# Patient Record
Sex: Male | Born: 1960 | Race: White | Hispanic: Yes | Marital: Married | Smoking: Never smoker
Health system: Southern US, Community
[De-identification: ages and names within clinical notes are randomized; demographics above are authoritative.]

## PROBLEM LIST (undated history)

## (undated) DIAGNOSIS — Z860100 Personal history of colon polyps, unspecified: Secondary | ICD-10-CM

## (undated) HISTORY — DX: Personal history of colon polyps, unspecified: Z86.0100

---

## 1981-04-05 HISTORY — PX: APPENDECTOMY: SHX54

## 1996-04-05 HISTORY — PX: COLONOSCOPY: SHX174

## 2006-12-30 ENCOUNTER — Ambulatory Visit: Payer: Self-pay

## 2014-04-05 HISTORY — PX: ESOPHAGOGASTRODUODENOSCOPY: SHX1529

## 2014-06-04 ENCOUNTER — Ambulatory Visit
Admission: RE | Admit: 2014-06-04 | Discharge: 2014-06-04 | Disposition: A | Discharge status: Home / Self Care | Payer: Managed Care, Other (non HMO) | Source: Ambulatory Visit | Attending: Internal Medicine | Admitting: Internal Medicine

## 2014-06-04 ENCOUNTER — Other Ambulatory Visit: Payer: Self-pay | Admitting: Internal Medicine

## 2014-06-04 DIAGNOSIS — K5909 Other constipation: Secondary | ICD-10-CM

## 2014-06-04 DIAGNOSIS — K625 Hemorrhage of anus and rectum: Secondary | ICD-10-CM

## 2014-06-04 DIAGNOSIS — R059 Cough, unspecified: Secondary | ICD-10-CM

## 2014-06-04 DIAGNOSIS — R109 Unspecified abdominal pain: Secondary | ICD-10-CM

## 2014-06-04 DIAGNOSIS — R05 Cough: Secondary | ICD-10-CM

## 2014-06-06 ENCOUNTER — Other Ambulatory Visit: Payer: Self-pay | Admitting: Internal Medicine

## 2014-06-06 DIAGNOSIS — R109 Unspecified abdominal pain: Secondary | ICD-10-CM

## 2014-06-06 DIAGNOSIS — R1013 Epigastric pain: Secondary | ICD-10-CM

## 2014-06-10 ENCOUNTER — Other Ambulatory Visit: Payer: Managed Care, Other (non HMO)

## 2014-06-14 ENCOUNTER — Ambulatory Visit
Admission: RE | Admit: 2014-06-14 | Discharge: 2014-06-14 | Disposition: A | Discharge status: Home / Self Care | Payer: Managed Care, Other (non HMO) | Source: Ambulatory Visit | Attending: Internal Medicine | Admitting: Internal Medicine

## 2014-06-14 DIAGNOSIS — R109 Unspecified abdominal pain: Secondary | ICD-10-CM

## 2014-06-14 DIAGNOSIS — R1013 Epigastric pain: Secondary | ICD-10-CM

## 2015-11-12 IMAGING — RF DG UGI W/ HIGH DENSITY W/KUB
19 of 24 series · 19 of 24 positions shown · non-contrast
Comparison: KUB 06/04/2014.

CLINICAL DATA: 53-year-old male with abdominal pain and dyspepsia.
Initial encounter.

EXAM:
UPPER GI SERIES WITH KUB
TECHNIQUE: After obtaining a scout radiograph a routine upper GI series was
performed using effervescent crystals and barium
FLUOROSCOPY TIME:  Radiation Exposure Index (as provided by the
fluoroscopic device): 140 d GY cm2
If the device does not provide the exposure index:
Fluoroscopy Time (in minutes and seconds):  4 minutes 48 seconds
Number of Acquired Images:  5

[Series 1: run · 1 of 1 slices shown (1 of 19)]
[im 1/1]
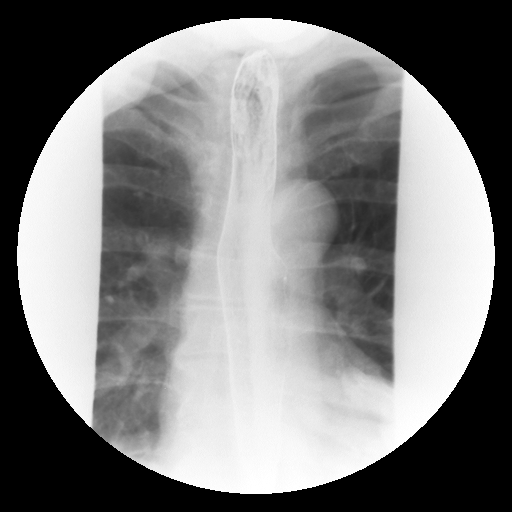

[Series 2: run · 1 of 1 slices shown (2 of 19)]
[im 1/1]
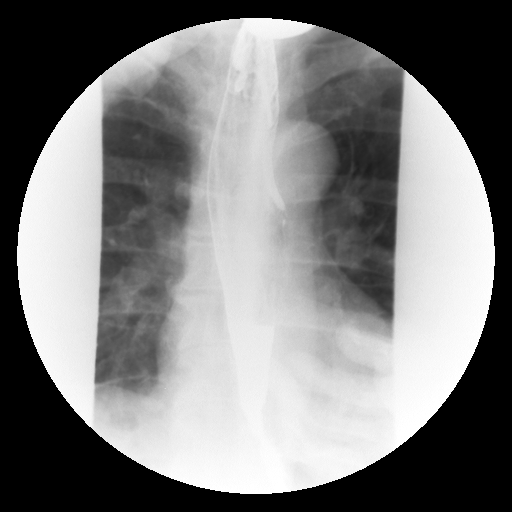

[Series 4: run · 1 of 1 slices shown (3 of 19)]
[im 1/1]
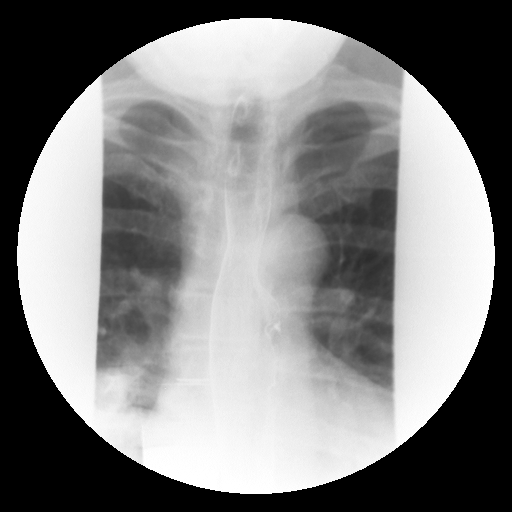

[Series 5: run · 1 of 1 slices shown (4 of 19)]
[im 1/1]
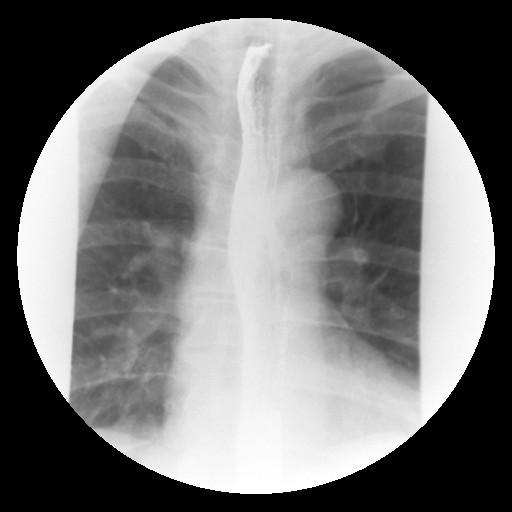

[Series 6: run · 1 of 1 slices shown (5 of 19)]
[im 1/1]
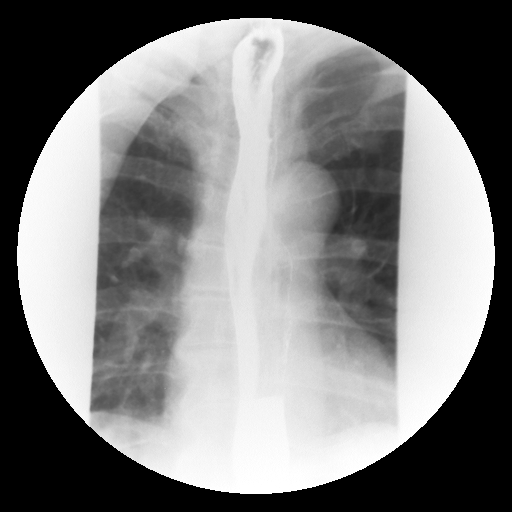

[Series 7: run · 1 of 1 slices shown (6 of 19)]
[im 1/1]
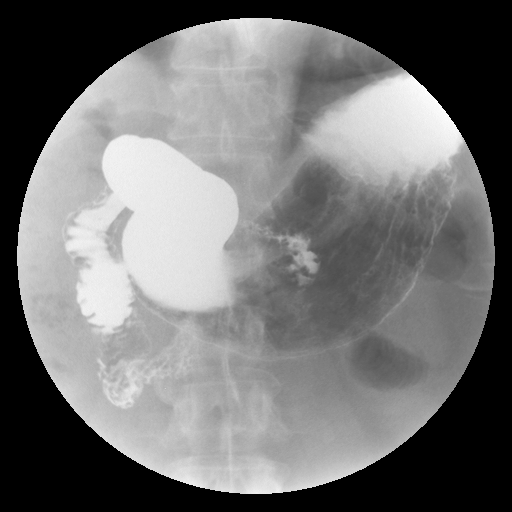

[Series 9: run · 1 of 1 slices shown (7 of 19)]
[im 1/1]
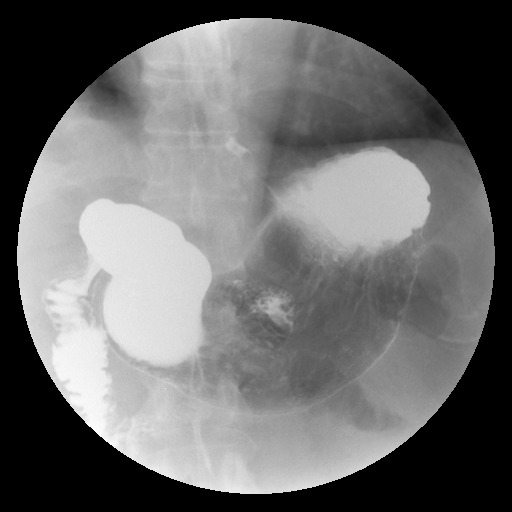

[Series 10: run · 1 of 1 slices shown (8 of 19)]
[im 1/1]
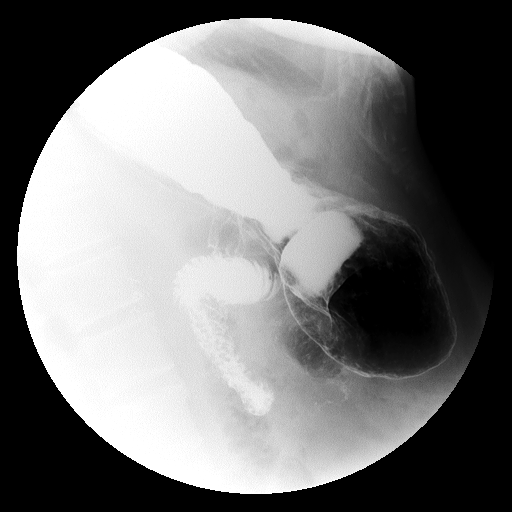

[Series 11: run · 1 of 1 slices shown (9 of 19)]
[im 1/1]
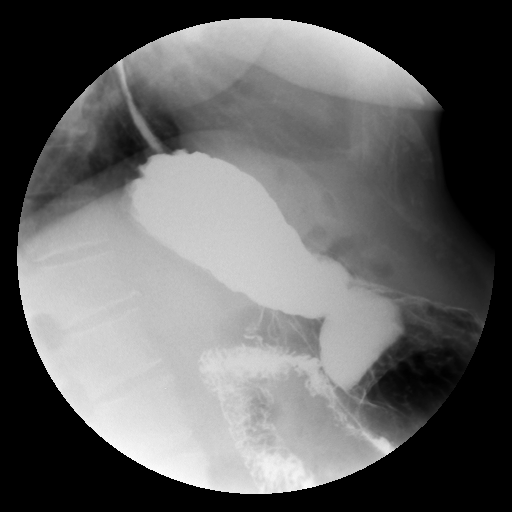

[Series 13: run · 1 of 1 slices shown (10 of 19)]
[im 1/1]
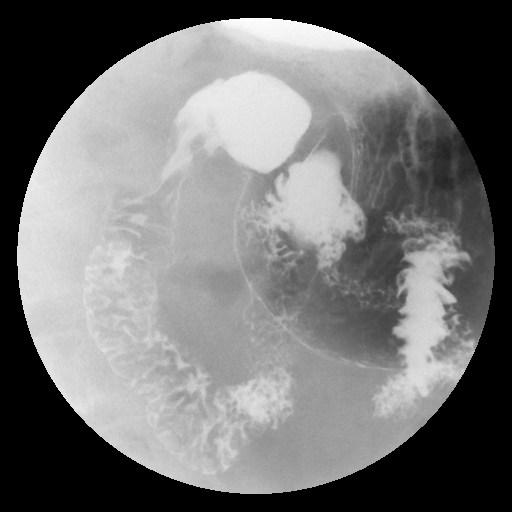

[Series 14: run · 1 of 1 slices shown (11 of 19)]
[im 1/1]
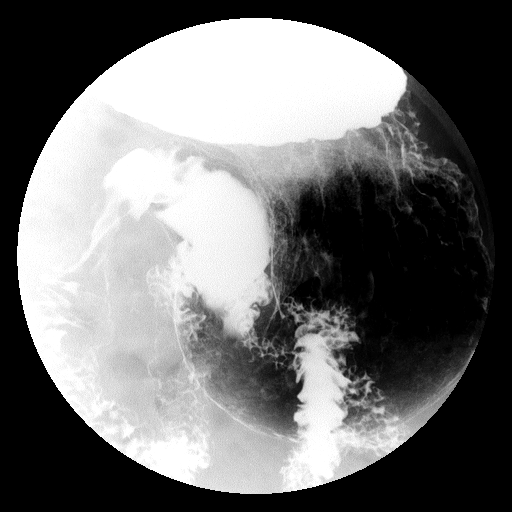

[Series 15: run · 1 of 1 slices shown (12 of 19)]
[im 1/1]
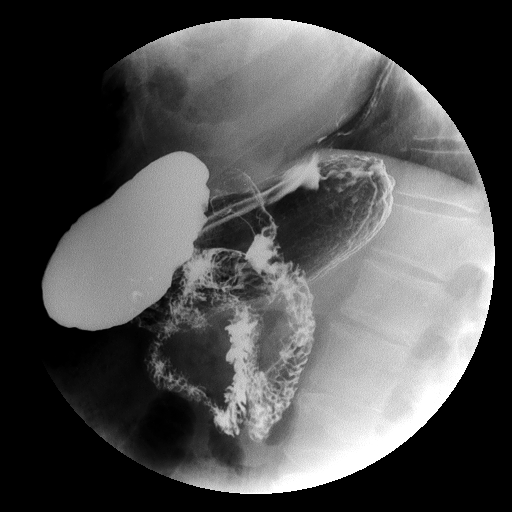

[Series 16: run · 1 of 1 slices shown (13 of 19)]
[im 1/1]
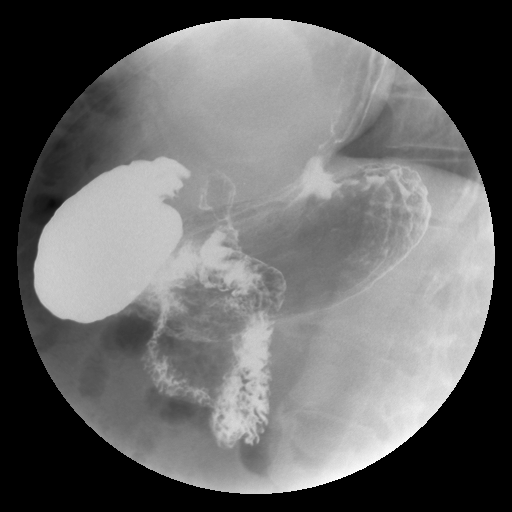

[Series 18: run · 1 of 1 slices shown (14 of 19)]
[im 1/1]
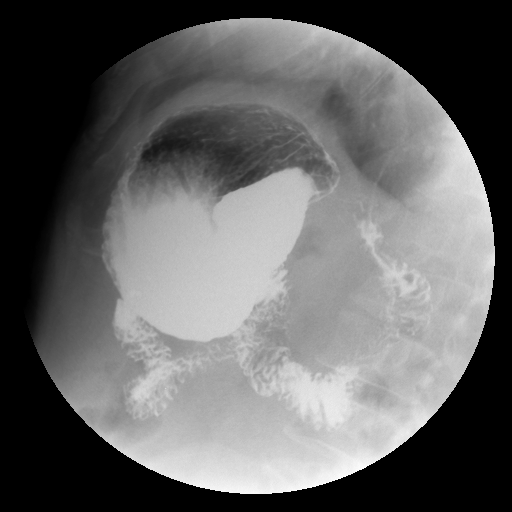

[Series 19: run · 1 of 1 slices shown (15 of 19)]
[im 1/1]
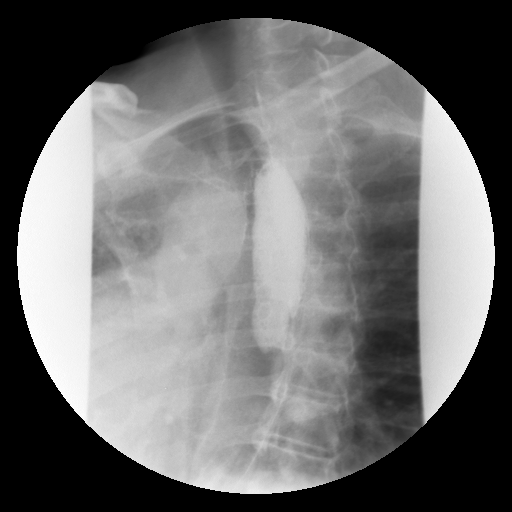

[Series 20: run · 1 of 1 slices shown (16 of 19)]
[im 1/1]
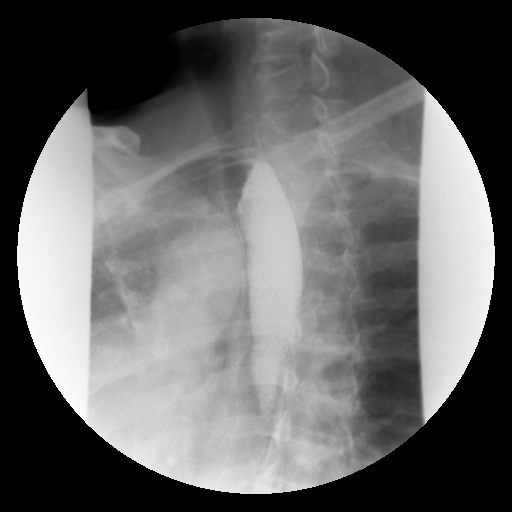

[Series 21: run · 1 of 1 slices shown (17 of 19)]
[im 1/1]
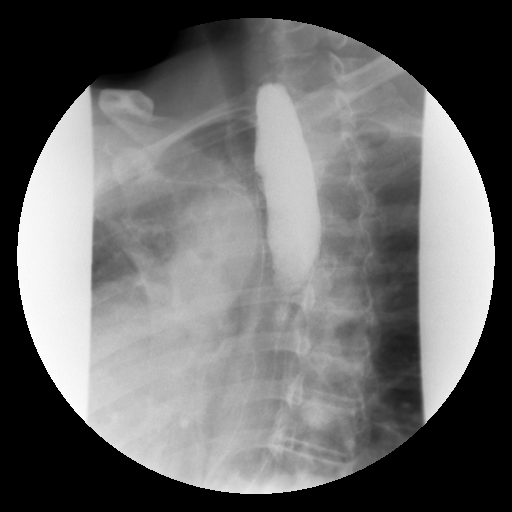

[Series 23: run · 1 of 1 slices shown (18 of 19)]
[im 1/1]
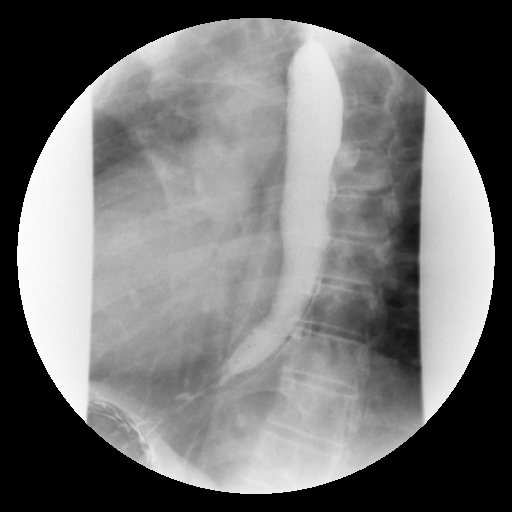

[Series 24: run · 1 of 1 slices shown (19 of 19)]
[im 1/1]
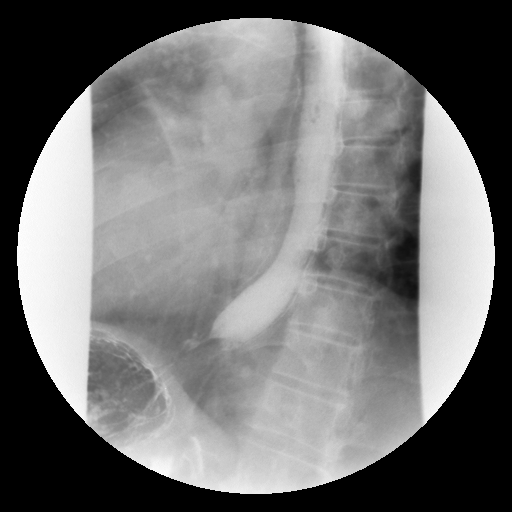

[19 of 24 positions shown; findings below may reference images not displayed]

FINDINGS: A preprocedural scout view of the abdomen is obtained. Non
obstructed bowel gas pattern with increased retained stool from the
recent comparison. Abdominal and pelvic visceral contours are within
normal limits. No acute osseous abnormality identified.

A double contrast study was undertaken and the patient tolerated
this well and without difficulty. Due to some language barrier,
positioning throughout the exam was difficult.

No obstruction to the forward flow of contrast throughout the
esophagus and into the stomach. Patulous esophagus is noted. No
esophageal mucosal lesion is identified.

Good gastric coating with barium. During gastric imaging spontaneous
gastroesophageal reflux was occasionally noted (series 11, series
17). Prompt gastric emptying. Normal gastric mucosal pattern.
Gastric antrum and duodenum bulb are within normal limits. Duodenum
C loop and mucosal pattern within normal limits.

With prone swallows a small sliding-type hiatal hernia became
apparent. Generalized decreased esophageal motility.
IMPRESSION: 1. Patulous esophagus with generalized decreased esophageal
motility.
2. Spontaneous gastroesophageal reflux. Small sliding-type hiatal
hernia.
3. Otherwise negative stomach and duodenum.

## 2016-11-02 HISTORY — PX: COLONOSCOPY: SHX174

## 2018-04-03 ENCOUNTER — Other Ambulatory Visit: Payer: Self-pay

## 2018-04-03 ENCOUNTER — Encounter: Payer: Self-pay | Admitting: Emergency Medicine

## 2018-04-03 ENCOUNTER — Ambulatory Visit: Payer: BC Managed Care – PPO | Admitting: Emergency Medicine

## 2018-04-03 VITALS — BP 107/66 | HR 77 | Temp 98.3°F | Resp 16 | Ht 67.25 in | Wt 173.8 lb

## 2018-04-03 DIAGNOSIS — K219 Gastro-esophageal reflux disease without esophagitis: Secondary | ICD-10-CM | POA: Insufficient documentation

## 2018-04-03 DIAGNOSIS — Z7689 Persons encountering health services in other specified circumstances: Secondary | ICD-10-CM

## 2018-04-03 MED ORDER — ESOMEPRAZOLE MAGNESIUM 40 MG PO CPDR: 40.0000 mg | DELAYED_RELEASE_CAPSULE | Freq: Every day | ORAL | 3 refills | Status: DC

## 2018-04-03 NOTE — Progress Notes (Signed)
Robert Dudley 57 y.o.   Chief Complaint  Patient presents with  . Establish Care  . Gastroesophageal Reflux    HISTORY OF PRESENT ILLNESS: This is a 57 y.o. male here to establish care with me.  First time visit.  Has a history of GERD.  No other significant medical history.  No other complaints or medical concerns today.  HPI   Prior to Admission medications   Not on File    No Known Allergies  There are no active problems to display for this patient.   No past medical history on file.    Social History   Socioeconomic History  . Marital status: Married    Spouse name: Not on file  . Number of children: Not on file  . Years of education: Not on file  . Highest education level: Not on file  Occupational History  . Not on file  Social Needs  . Financial resource strain: Not on file  . Food insecurity:    Worry: Not on file    Inability: Not on file  . Transportation needs:    Medical: Not on file    Non-medical: Not on file  Tobacco Use  . Smoking status: Never Smoker  . Smokeless tobacco: Never Used  Substance and Sexual Activity  . Alcohol use: Yes    Comment: sometimes  . Drug use: Never  . Sexual activity: Not on file  Lifestyle  . Physical activity:    Days per week: Not on file    Minutes per session: Not on file  . Stress: Not on file  Relationships  . Social connections:    Talks on phone: Not on file    Gets together: Not on file    Attends religious service: Not on file    Active member of club or organization: Not on file    Attends meetings of clubs or organizations: Not on file    Relationship status: Not on file  . Intimate partner violence:    Fear of current or ex partner: Not on file    Emotionally abused: Not on file    Physically abused: Not on file    Forced sexual activity: Not on file  Other Topics Concern  . Not on file  Social History Narrative  . Not on file    No family history on file.   Review of Systems    Constitutional: Negative.  Negative for chills and fever.  HENT: Negative.   Eyes: Negative.   Respiratory: Negative.  Negative for cough and shortness of breath.   Cardiovascular: Negative.  Negative for chest pain.  Gastrointestinal: Positive for heartburn. Negative for abdominal pain, blood in stool, diarrhea, melena and vomiting.  Genitourinary: Negative.  Negative for dysuria.  Skin: Negative.  Negative for rash.  Neurological: Negative.  Negative for dizziness and headaches.  All other systems reviewed and are negative.  Vitals:   04/03/18 1328  BP: 107/66  Pulse: 77  Resp: 16  Temp: 98.3 F (36.8 C)  SpO2: 97%     Physical Exam Vitals signs reviewed.  Constitutional:      Appearance: Normal appearance.  HENT:     Head: Normocephalic and atraumatic.     Nose: Nose normal.  Eyes:     Extraocular Movements: Extraocular movements intact.     Pupils: Pupils are equal, round, and reactive to light.  Neck:     Musculoskeletal: Normal range of motion and neck supple.  Cardiovascular:  Rate and Rhythm: Normal rate and regular rhythm.     Pulses: Normal pulses.     Heart sounds: Normal heart sounds.  Pulmonary:     Effort: Pulmonary effort is normal.     Breath sounds: Normal breath sounds.  Abdominal:     General: Abdomen is flat. Bowel sounds are normal. There is no distension.     Palpations: Abdomen is soft. There is no mass.  Musculoskeletal: Normal range of motion.  Skin:    General: Skin is warm and dry.  Neurological:     General: No focal deficit present.     Mental Status: He is alert and oriented to person, place, and time.  Psychiatric:        Mood and Affect: Mood normal.        Behavior: Behavior normal.      A total of 30 minutes was spent in the room with the patient, greater than 50% of which was in counseling/coordination of care regarding GERD, treatment, medications, diet and nutrition, and need for follow-up.  ASSESSMENT & PLAN: Robert Dudley  was seen today for establish care and gastroesophageal reflux.  Diagnoses and all orders for this visit:  Gastroesophageal reflux disease without esophagitis -     esomeprazole (NEXIUM) 40 MG capsule; Take 1 capsule (40 mg total) by mouth daily.  Encounter to establish care    Patient Instructions       If you have lab work done today you will be contacted with your lab results within the next 2 weeks.  If you have not heard from us then please contact us. The fastest way to get your results is to register for My Chart.   IF you received an x-ray today, you will receive an invoice from [facility]. Please contact [facility] at [phone] with questions or concerns regarding your invoice.   IF you received labwork today, you will receive an invoice from [facility]. Please contact [facility] at [phone] with questions or concerns regarding your invoice.   Our billing staff will not be able to assist you with questions regarding bills from these companies.  You will be contacted with the lab results as soon as they are available. The fastest way to get your results is to activate your My Chart account. Instructions are located on the last page of this paperwork. If you have not heard from us regarding the results in 2 weeks, please contact this office.     Opciones de alimentos para pacientes adultos con enfermedad de reflujo gastroesofgico Food Choices for Gastroesophageal Reflux Disease, Adult Si tiene enfermedad de reflujo gastroesofgico (ERGE), los alimentos que consume y los hbitos de alimentacin son muy importantes. Elegir los alimentos adecuados puede ayudar a Altria Groupaliviar las molestias. Piense en consultar a un especialista en nutricin (nutricionista) para que lo ayude a Associate Professorhacer buenas elecciones. Consejos para seguir Goodrich Corporationeste plan  Comidas  Elija alimentos saludables con bajo contenido de grasa, como frutas, verduras, cereales integrales, productos lcteos  descremados y carne [place] de [place], de pescado y de [place].  Haga comidas pequeas durante Glass blower/designerel da en lugar de 3 comidas abundantes. Coma lentamente y en un lugar donde est distendido. Evite agacharse o recostarse hasta 2 o 3horas despus de haber comido.  Evite comer 2 a 3horas antes de ir a acostarse.  Evite beber grandes cantidades de lquidos con las comidas.  Evite frer los alimentos a la hora de la coccin. Puede hornear, grillar o asar a la parrilla.  Evite o  limite la cantidad de: ? Chocolate. ? Menta y mentol. ? Alcohol. ? Pimienta. ? Caf negro y descafeinado. ? T negro y descafeinado. ? Bebidas con gas (gaseosas). ? Bebidas energizantes y refrescos que contengan cafena.  Limite los alimentos con alto contenido de [place], por ejemplo: ? Carnes grasas o alimentos fritos. ? Leche entera, crema, manteca o helado. ? Nueces y [place] de frutos secos. ? Pastelera, donas y dulces hechos con [place] o [place].  Evite los alimentos que le ocasionen sntomas. Estos pueden ser distintos para Advertising account planner. Los alimentos que suelen causan sntomas son los siguientes: ? Haematologist. ? Naranjas, limones y limas. ? Pimientos. ? Comidas condimentadas. ? Cebolla y Kae Heller. ? Vinagre. Estilo de vida  Mantenga un peso saludable. Pregntele a su mdico cul es el peso saludable para usted. Si necesita perder peso, hable con su mdico para hacerlo de manera segura.  Realice actividad fsica durante, al menos, 30 minutos 5 das por semana o ms, o segn lo indicado por su mdico.  Use ropa suelta.  No fume. Si necesita ayuda para dejar de fumar, consulte al mdico.  Duerma con la cabecera de la cama ms elevada que los pies. Use una cua debajo del colchn o bloques debajo del armazn de la cama para Pharmacologist la cabecera de la cama elevada. Resumen  Si tiene enfermedad de reflujo gastroesofgico (ERGE), las elecciones de alimentos y el [place] de vida son muy importantes para ayudar  a Paramedic los sntomas.  Haga comidas pequeas durante Glass blower/designer de 3 comidas abundantes. Coma lentamente y en un lugar donde est distendido.  Limite los alimentos con alto contenido graso como la carne grasa o los alimentos fritos.  Evite agacharse o recostarse hasta 2 o 3horas despus de haber comido.  Evite la menta y [place] buena, la cafena, el alcohol y el chocolate. Esta informacin no tiene Theme park manager el consejo del mdico. Asegrese de hacerle al mdico cualquier pregunta que tenga. Document Released: 09/21/2011 Document Revised: 10/26/2016 Document Reviewed: 10/26/2016 Elsevier Interactive Patient Education  2019 Elsevier Inc.      Edwina Barth, MD [facility]

## 2018-04-03 NOTE — Patient Instructions (Addendum)
   If you have lab work done today you will be contacted with your lab results within the next 2 weeks.  If you have not heard from us then please contact us. The fastest way to get your results is to register for My Chart.   IF you received an x-ray today, you will receive an invoice from [facility]. Please contact [facility] at [phone] with questions or concerns regarding your invoice.   IF you received labwork today, you will receive an invoice from [facility]. Please contact [facility] at [phone] with questions or concerns regarding your invoice.   Our billing staff will not be able to assist you with questions regarding bills from these companies.  You will be contacted with the lab results as soon as they are available. The fastest way to get your results is to activate your My Chart account. Instructions are located on the last page of this paperwork. If you have not heard from us regarding the results in 2 weeks, please contact this office.     Opciones de alimentos para pacientes adultos con enfermedad de reflujo gastroesofgico Food Choices for Gastroesophageal Reflux Disease, Adult Si tiene enfermedad de reflujo gastroesofgico (ERGE), los alimentos que consume y los hbitos de alimentacin son muy importantes. Elegir los alimentos adecuados puede ayudar a aliviar las molestias. Piense en consultar a un especialista en nutricin (nutricionista) para que lo ayude a hacer buenas elecciones. Consejos para seguir este plan  Comidas  Elija alimentos saludables con bajo contenido de grasa, como frutas, verduras, cereales integrales, productos lcteos descremados y carne magra de vaca, de pescado y de ave.  Haga comidas pequeas durante el da en lugar de 3 comidas abundantes. Coma lentamente y en un lugar donde est distendido. Evite agacharse o recostarse hasta 2 o 3horas despus de haber comido.  Evite comer 2 a 3horas antes de ir a acostarse.  Evite  beber grandes cantidades de lquidos con las comidas.  Evite frer los alimentos a la hora de la coccin. Puede hornear, grillar o asar a la parrilla.  Evite o limite la cantidad de: ? Chocolate. ? Menta y mentol. ? Alcohol. ? Pimienta. ? Caf negro y descafeinado. ? T negro y descafeinado. ? Bebidas con gas (gaseosas). ? Bebidas energizantes y refrescos que contengan cafena.  Limite los alimentos con alto contenido de grasas, por ejemplo: ? Carnes grasas o alimentos fritos. ? Leche entera, crema, manteca o helado. ? Nueces y mantequillas de frutos secos. ? Pastelera, donas y dulces hechos con manteca o margarina.  Evite los alimentos que le ocasionen sntomas. Estos pueden ser distintos para cada persona. Los alimentos que suelen causan sntomas son los siguientes: ? Tomates. ? Naranjas, limones y limas. ? Pimientos. ? Comidas condimentadas. ? [place]ebolla y ajo. ? Vinagre. Estilo de vida  Mantenga un peso saludable. Pregntele a su mdico cul es el peso saludable para usted. Si necesita perder peso, hable con su mdico para hacerlo de manera segura.  Realice actividad fsica durante, al menos, 30 minutos 5 das por semana o ms, o segn lo indicado por su mdico.  Use ropa suelta.  No fume. Si necesita ayuda para dejar de fumar, consulte al mdico.  Duerma con la cabecera de la cama ms elevada que los pies. Use una cua debajo del colchn o bloques debajo del armazn de la cama para mantener la cabecera de la cama elevada. Resumen  Si tiene enfermedad de reflujo gastroesofgico (ERGE), las elecciones de alimentos y el estilo   de vida son muy importantes para ayudar a aliviar los sntomas.  Haga comidas pequeas durante el da en lugar de 3 comidas abundantes. Coma lentamente y en un lugar donde est distendido.  Limite los alimentos con alto contenido graso como la carne grasa o los alimentos fritos.  Evite agacharse o recostarse hasta 2 o 3horas despus de haber  comido.  Evite la menta y [place]ba buena, la cafena, el alcohol y el chocolate. Esta informacin no tiene como fin reemplazar el consejo del mdico. Asegrese de hacerle al mdico cualquier pregunta que tenga. Document Released: 09/21/2011 Document Revised: 10/26/2016 Document Reviewed: 10/26/2016 Elsevier Interactive Patient Education  2019 Elsevier Inc.  

## 2018-06-16 ENCOUNTER — Ambulatory Visit (INDEPENDENT_AMBULATORY_CARE_PROVIDER_SITE_OTHER): Payer: BC Managed Care – PPO | Admitting: Emergency Medicine

## 2018-06-16 ENCOUNTER — Other Ambulatory Visit: Payer: Self-pay

## 2018-06-16 ENCOUNTER — Encounter: Payer: Self-pay | Admitting: Emergency Medicine

## 2018-06-16 VITALS — BP 113/63 | HR 73 | Temp 98.1°F | Ht 67.0 in | Wt 176.0 lb

## 2018-06-16 DIAGNOSIS — Z0000 Encounter for general adult medical examination without abnormal findings: Secondary | ICD-10-CM

## 2018-06-16 DIAGNOSIS — Z1329 Encounter for screening for other suspected endocrine disorder: Secondary | ICD-10-CM

## 2018-06-16 DIAGNOSIS — Z13220 Encounter for screening for lipoid disorders: Secondary | ICD-10-CM | POA: Diagnosis not present

## 2018-06-16 DIAGNOSIS — Z130 Encounter for screening for diseases of the blood and blood-forming organs and certain disorders involving the immune mechanism: Secondary | ICD-10-CM | POA: Diagnosis not present

## 2018-06-16 DIAGNOSIS — Z125 Encounter for screening for malignant neoplasm of prostate: Secondary | ICD-10-CM | POA: Diagnosis not present

## 2018-06-16 DIAGNOSIS — Z13228 Encounter for screening for other metabolic disorders: Secondary | ICD-10-CM | POA: Diagnosis not present

## 2018-06-16 LAB — POCT URINALYSIS DIP (MANUAL ENTRY)
Bilirubin, UA: NEGATIVE
Blood, UA: NEGATIVE
GLUCOSE UA: NEGATIVE mg/dL
Nitrite, UA: NEGATIVE
Protein Ur, POC: 100 mg/dL — AB
Spec Grav, UA: 1.02 (ref 1.010–1.025)
Urobilinogen, UA: 0.2 E.U./dL
pH, UA: 7 (ref 5.0–8.0)

## 2018-06-16 NOTE — Patient Instructions (Addendum)

## 2018-06-16 NOTE — Progress Notes (Signed)
Robert Dudley 58 y.o.   Chief Complaint  Patient presents with  . Annual Exam    CPE    HISTORY OF PRESENT ILLNESS: This is a 58 y.o. male here for his annual exam.  Healthy man with no chronic medical problems.  On no chronic medications.  Non-smoker.  No EtOH abuser.  Physically active at work.  Has no complaints or medical concerns today.  HPI   Prior to Admission medications   Medication Sig Start Date End Date Taking? Authorizing Provider  fexofenadine (ALLEGRA) 180 MG tablet Take 180 mg by mouth daily.   Yes [provider]    No Known Allergies  Patient Active Problem List   Diagnosis Date Noted  . Gastroesophageal reflux disease without esophagitis 04/03/2018    No past medical history on file.  No past surgical history on file.  Social History   Socioeconomic History  . Marital status: Married    Spouse name: Not on file  . Number of children: Not on file  . Years of education: Not on file  . Highest education level: Not on file  Occupational History  . Not on file  Social Needs  . Financial resource strain: Not on file  . Food insecurity:    Worry: Not on file    Inability: Not on file  . Transportation needs:    Medical: Not on file    Non-medical: Not on file  Tobacco Use  . Smoking status: Never Smoker  . Smokeless tobacco: Never Used  Substance and Sexual Activity  . Alcohol use: Yes    Comment: sometimes  . Drug use: Never  . Sexual activity: Not on file  Lifestyle  . Physical activity:    Days per week: Not on file    Minutes per session: Not on file  . Stress: Not on file  Relationships  . Social connections:    Talks on phone: Not on file    Gets together: Not on file    Attends religious service: Not on file    Active member of club or organization: Not on file    Attends meetings of clubs or organizations: Not on file    Relationship status: Not on file  . Intimate partner violence:    Fear of current or ex partner:  Not on file    Emotionally abused: Not on file    Physically abused: Not on file    Forced sexual activity: Not on file  Other Topics Concern  . Not on file  Social History Narrative  . Not on file    No family history on file.   Review of Systems  Constitutional: Negative.  Negative for chills and fever.  HENT: Negative.  Negative for congestion, nosebleeds, sinus pain and sore throat.   Eyes: Negative.   Respiratory: Negative.  Negative for cough and shortness of breath.   Cardiovascular: Negative.  Negative for chest pain and palpitations.  Gastrointestinal: Negative.  Negative for abdominal pain, diarrhea, nausea and vomiting.  Genitourinary: Negative.  Negative for dysuria and hematuria.  Musculoskeletal: Negative.  Negative for back pain, myalgias and neck pain.  Skin: Negative.  Negative for rash.  Neurological: Negative.  Negative for dizziness and headaches.  Endo/Heme/Allergies: Negative.   All other systems reviewed and are negative.    Vitals:   06/16/18 0814  BP: 113/63  Pulse: 73  Temp: 98.1 F (36.7 C)  SpO2: 99%     Physical Exam Vitals signs reviewed.  Constitutional:  Appearance: Normal appearance.  HENT:     Head: Normocephalic and atraumatic.     Nose: Nose normal.     Mouth/Throat:     Mouth: Mucous membranes are moist.     Pharynx: Oropharynx is clear.  Eyes:     Extraocular Movements: Extraocular movements intact.     Conjunctiva/sclera: Conjunctivae normal.     Pupils: Pupils are equal, round, and reactive to light.  Neck:     Musculoskeletal: Normal range of motion and neck supple.  Cardiovascular:     Rate and Rhythm: Normal rate and regular rhythm.     Heart sounds: Normal heart sounds.  Pulmonary:     Effort: Pulmonary effort is normal.     Breath sounds: Normal breath sounds.  Abdominal:     General: There is no distension.     Palpations: Abdomen is soft. There is no mass.     Tenderness: There is no abdominal  tenderness.  Musculoskeletal: Normal range of motion.  Skin:    General: Skin is warm and dry.     Capillary Refill: Capillary refill takes less than 2 seconds.  Neurological:     General: No focal deficit present.     Mental Status: He is alert and oriented to person, place, and time.     Sensory: No sensory deficit.     Motor: No weakness.  Psychiatric:        Mood and Affect: Mood normal.        Behavior: Behavior normal.      ASSESSMENT & PLAN: Robert Dudley was seen today for annual exam.  Diagnoses and all orders for this visit:  Routine general medical examination at a health care facility -     POCT urinalysis dipstick  Prostate cancer screening -     PSA(Must document that pt has been informed of limitations of PSA testing.)  Screening for deficiency anemia -     CBC with Differential  Screening for lipoid disorders -     Lipid panel  Screening for endocrine, metabolic and immunity disorder -     Comprehensive metabolic panel -     Hemoglobin A1c -     Lipid panel -     POCT urinalysis dipstick    Patient Instructions       If you have lab work done today you will be contacted with your lab results within the next 2 weeks.  If you have not heard from [place] then please contact us. The fastest way to get your results is to register for My Chart.   IF you received an x-ray today, you will receive an invoice from [facility]. Please contact [facility] at [phone] with questions or concerns regarding your invoice.   IF you received labwork today, you will receive an invoice from [place]. Please contact [facility] at [phone] with questions or concerns regarding your invoice.   Our billing staff will not be able to assist you with questions regarding bills from these companies.  You will be contacted with the lab results as soon as they are available. The fastest way to get your results is to activate your My Chart account. Instructions are  located on the last page of this paperwork. If you have not heard from [place] regarding the results in 2 weeks, please contact this office.       Health Maintenance, Male A healthy lifestyle and preventive care is important for your health and wellness. Ask your health care provider about  what schedule of regular examinations is right for you. What should I know about weight and diet? Eat a Healthy Diet  Eat plenty of vegetables, fruits, whole grains, low-fat dairy products, and lean protein.  Do not eat a lot of foods high in solid fats, added sugars, or salt.  Maintain a Healthy Weight Regular exercise can help you achieve or maintain a healthy weight. You should:  Do at least 150 minutes of exercise each week. The exercise should increase your heart rate and make you sweat (moderate-intensity exercise).  Do strength-training exercises at least twice a week. Watch Your Levels of Cholesterol and Blood Lipids  Have your blood tested for lipids and cholesterol every 5 years starting at 57 years of age. If you are at high risk for heart disease, you should start having your blood tested when you are 57 years old. You may need to have your cholesterol levels checked more often if: ? Your lipid or cholesterol levels are high. ? You are older than 57 years of age. ? You are at high risk for heart disease. What should I know about cancer screening? Many types of cancers can be detected early and may often be prevented. Lung Cancer  You should be screened every year for lung cancer if: ? You are a current smoker who has smoked for at least 30 years. ? You are a former smoker who has quit within the past 15 years.  Talk to your health care provider about your screening options, when you should start screening, and how often you should be screened. Colorectal Cancer  Routine colorectal cancer screening usually begins at 57 years of age and should be repeated every 5-10 years until you are 57  years old. You may need to be screened more often if early forms of precancerous polyps or small growths are found. Your health care provider may recommend screening at an earlier age if you have risk factors for colon cancer.  Your health care provider may recommend using home test kits to check for hidden blood in the stool.  A small camera at the end of a tube can be used to examine your colon (sigmoidoscopy or colonoscopy). This checks for the earliest forms of colorectal cancer. Prostate and Testicular Cancer  Depending on your age and overall health, your health care provider may do certain tests to screen for prostate and testicular cancer.  Talk to your health care provider about any symptoms or concerns you have about testicular or prostate cancer. Skin Cancer  Check your skin from head to toe regularly.  Tell your health care provider about any new moles or changes in moles, especially if: ? There is a change in a mole's size, shape, or color. ? You have a mole that is larger than a pencil eraser.  Always use sunscreen. Apply sunscreen liberally and repeat throughout the day.  Protect yourself by wearing long sleeves, pants, a wide-brimmed hat, and sunglasses when outside. What should I know about heart disease, diabetes, and high blood pressure?  If you are 107-71 years of age, have your blood pressure checked every 3-5 years. If you are 60 years of age or older, have your blood pressure checked every year. You should have your blood pressure measured twice-once when you are at a hospital or clinic, and once when you are not at a hospital or clinic. Record the average of the two measurements. To check your blood pressure when you are not at a hospital or  clinic, you can use: ? An automated blood pressure machine at a pharmacy. ? A home blood pressure monitor.  Talk to your health care provider about your target blood pressure.  If you are between 76-54 years old, ask your  health care provider if you should take aspirin to prevent heart disease.  Have regular diabetes screenings by checking your fasting blood sugar level. ? If you are at a normal weight and have a low risk for diabetes, have this test once every three years after the age of 65. ? If you are overweight and have a high risk for diabetes, consider being tested at a younger age or more often.  A one-time screening for abdominal aortic aneurysm (AAA) by ultrasound is recommended for men aged 65-75 years who are current or former smokers. What should I know about preventing infection? Hepatitis B If you have a higher risk for hepatitis B, you should be screened for this virus. Talk with your health care provider to find out if you are at risk for hepatitis B infection. Hepatitis C Blood testing is recommended for:  Everyone born from [birth year] through 1964.  Anyone with known risk factors for hepatitis C. Sexually Transmitted Diseases (STDs)  You should be screened each year for STDs including gonorrhea and chlamydia if: ? You are sexually active and are younger than 57 years of age. ? You are older than 57 years of age and your health care provider tells you that you are at risk for this type of infection. ? Your sexual activity has changed since you were last screened and you are at an increased risk for chlamydia or gonorrhea. Ask your health care provider if you are at risk.  Talk with your health care provider about whether you are at high risk of being infected with HIV. Your health care provider may recommend a prescription medicine to help prevent HIV infection. What else can I do?  Schedule regular health, dental, and eye exams.  Stay current with your vaccines (immunizations).  Do not use any tobacco products, such as cigarettes, chewing tobacco, and e-cigarettes. If you need help quitting, ask your health care provider.  Limit alcohol intake to no more than 2 drinks per day. One drink  equals 12 ounces of beer, 5 ounces of wine, or 1 ounces of hard liquor.  Do not use street drugs.  Do not share needles.  Ask your health care provider for help if you need support or information about quitting drugs.  Tell your health care provider if you often feel depressed.  Tell your health care provider if you have ever been abused or do not feel safe at home. This information is not intended to replace advice given to you by your health care provider. Make sure you discuss any questions you have with your health care provider. Document Released: 09/18/2007 Document Revised: 11/19/2015 Document Reviewed: 12/24/2014 Elsevier Interactive Patient Education  2019 [employer]      Edwina Barth, MD [facility]

## 2018-06-17 LAB — CBC WITH DIFFERENTIAL/PLATELET
BASOS ABS: 0.1 10*3/uL (ref 0.0–0.2)
Basos: 1 %
EOS (ABSOLUTE): 0 10*3/uL (ref 0.0–0.4)
Eos: 1 %
Hematocrit: 43 % (ref 37.5–51.0)
Hemoglobin: 14.6 g/dL (ref 13.0–17.7)
Immature Grans (Abs): 0 10*3/uL (ref 0.0–0.1)
Immature Granulocytes: 0 %
Lymphocytes Absolute: 1.6 10*3/uL (ref 0.7–3.1)
Lymphs: 22 %
MCH: 31.6 pg (ref 26.6–33.0)
MCHC: 34 g/dL (ref 31.5–35.7)
MCV: 93 fL (ref 79–97)
MONOCYTES: 6 %
Monocytes Absolute: 0.4 10*3/uL (ref 0.1–0.9)
Neutrophils Absolute: 5 10*3/uL (ref 1.4–7.0)
Neutrophils: 70 %
Platelets: 221 10*3/uL (ref 150–450)
RBC: 4.62 x10E6/uL (ref 4.14–5.80)
RDW: 12.4 % (ref 11.6–15.4)
WBC: 7.1 10*3/uL (ref 3.4–10.8)

## 2018-06-17 LAB — COMPREHENSIVE METABOLIC PANEL
ALT: 19 IU/L (ref 0–44)
AST: 20 IU/L (ref 0–40)
Albumin/Globulin Ratio: 1.7 (ref 1.2–2.2)
Albumin: 4.5 g/dL (ref 3.8–4.9)
Alkaline Phosphatase: 75 IU/L (ref 39–117)
BUN/Creatinine Ratio: 16 (ref 9–20)
BUN: 17 mg/dL (ref 6–24)
Bilirubin Total: 0.5 mg/dL (ref 0.0–1.2)
CO2: 24 mmol/L (ref 20–29)
Calcium: 9.3 mg/dL (ref 8.7–10.2)
Chloride: 99 mmol/L (ref 96–106)
Creatinine, Ser: 1.09 mg/dL (ref 0.76–1.27)
GFR calc Af Amer: 87 mL/min/{1.73_m2} (ref >59)
GFR calc non Af Amer: 75 mL/min/{1.73_m2} (ref >59)
GLUCOSE: 120 mg/dL — AB (ref 65–99)
Globulin, Total: 2.7 g/dL (ref 1.5–4.5)
Potassium: 4.6 mmol/L (ref 3.5–5.2)
Sodium: 140 mmol/L (ref 134–144)
Total Protein: 7.2 g/dL (ref 6.0–8.5)

## 2018-06-17 LAB — HEMOGLOBIN A1C
Est. average glucose Bld gHb Est-mCnc: 134 mg/dL
Hgb A1c MFr Bld: 6.3 % — ABNORMAL HIGH (ref 4.8–5.6)

## 2018-06-17 LAB — LIPID PANEL
CHOLESTEROL TOTAL: 169 mg/dL (ref 100–199)
Chol/HDL Ratio: 3.1 ratio (ref 0.0–5.0)
HDL: 55 mg/dL (ref >39)
LDL Calculated: 99 mg/dL (ref 0–99)
Triglycerides: 77 mg/dL (ref 0–149)
VLDL Cholesterol Cal: 15 mg/dL (ref 5–40)

## 2018-06-17 LAB — PSA: Prostate Specific Ag, Serum: 6.5 ng/mL — ABNORMAL HIGH (ref 0.0–4.0)

## 2018-06-19 ENCOUNTER — Encounter: Payer: Self-pay | Admitting: Emergency Medicine

## 2018-06-19 ENCOUNTER — Other Ambulatory Visit: Payer: Self-pay | Admitting: Emergency Medicine

## 2018-06-19 DIAGNOSIS — R972 Elevated prostate specific antigen [PSA]: Secondary | ICD-10-CM

## 2018-06-20 ENCOUNTER — Encounter: Payer: Self-pay | Admitting: Radiology

## 2018-07-01 ENCOUNTER — Encounter: Payer: Self-pay | Admitting: Emergency Medicine

## 2018-07-03 NOTE — Telephone Encounter (Signed)
He is inquiring about his urology referral.  He has elevated PSA.  Thanks.

## 2018-07-21 ENCOUNTER — Encounter: Payer: Self-pay | Admitting: Emergency Medicine

## 2019-01-09 ENCOUNTER — Encounter: Payer: Self-pay | Admitting: Emergency Medicine

## 2019-01-16 ENCOUNTER — Telehealth: Payer: Self-pay | Admitting: *Deleted

## 2019-01-16 ENCOUNTER — Ambulatory Visit: Payer: BC Managed Care – PPO | Admitting: Emergency Medicine

## 2019-01-16 ENCOUNTER — Encounter: Payer: Self-pay | Admitting: Emergency Medicine

## 2019-01-16 ENCOUNTER — Other Ambulatory Visit: Payer: Self-pay

## 2019-01-16 VITALS — BP 105/63 | HR 94 | Temp 98.6°F | Resp 16 | Ht 67.0 in | Wt 170.8 lb

## 2019-01-16 DIAGNOSIS — Z20828 Contact with and (suspected) exposure to other viral communicable diseases: Secondary | ICD-10-CM | POA: Diagnosis not present

## 2019-01-16 DIAGNOSIS — Z0189 Encounter for other specified special examinations: Secondary | ICD-10-CM | POA: Diagnosis not present

## 2019-01-16 DIAGNOSIS — Z20822 Contact with and (suspected) exposure to covid-19: Secondary | ICD-10-CM

## 2019-01-16 DIAGNOSIS — Z23 Encounter for immunization: Secondary | ICD-10-CM | POA: Diagnosis not present

## 2019-01-16 NOTE — Telephone Encounter (Signed)
Called patient to check if he has the results of COVID 19 test using Interpreter # [phone]. No answer left message in mobile voice mail to call back before 2:00 pm appointment.

## 2019-01-16 NOTE — Progress Notes (Signed)
Robert Dudley 58 y.o.   Chief Complaint  Patient presents with  . Immunizations    for flu vaccine and note to state he can return to work COVID results negative    HISTORY OF PRESENT ILLNESS: This is a 58 y.o. male here to get flu vaccine and requesting medical clearance for work note relating to Covid.  Patient was exposed to son who tested positive for Covid.  Patient never had any Covid symptoms.  Has remained asymptomatic. No complaints or medical concerns today.  HPI   Prior to Admission medications   Medication Sig Start Date End Date Taking? Authorizing Provider  fexofenadine (ALLEGRA) 180 MG tablet Take 180 mg by mouth daily.    [provider]    No Known Allergies  Patient Active Problem List   Diagnosis Date Noted  . Gastroesophageal reflux disease without esophagitis 04/03/2018    History reviewed. No pertinent past medical history.  History reviewed. No pertinent surgical history.  Social History   Socioeconomic History  . Marital status: Married    Spouse name: Not on file  . Number of children: Not on file  . Years of education: Not on file  . Highest education level: Not on file  Occupational History  . Not on file  Social Needs  . Financial resource strain: Not on file  . Food insecurity    Worry: Not on file    Inability: Not on file  . Transportation needs    Medical: Not on file    Non-medical: Not on file  Tobacco Use  . Smoking status: Never Smoker  . Smokeless tobacco: Never Used  Substance and Sexual Activity  . Alcohol use: Yes    Comment: sometimes  . Drug use: Never  . Sexual activity: Not on file  Lifestyle  . Physical activity    Days per week: Not on file    Minutes per session: Not on file  . Stress: Not on file  Relationships  . Social Herbalist on phone: Not on file    Gets together: Not on file    Attends religious service: Not on file    Active member of club or organization: Not on file   Attends meetings of clubs or organizations: Not on file    Relationship status: Not on file  . Intimate partner violence    Fear of current or ex partner: Not on file    Emotionally abused: Not on file    Physically abused: Not on file    Forced sexual activity: Not on file  Other Topics Concern  . Not on file  Social History Narrative  . Not on file    History reviewed. No pertinent family history.   Review of Systems  Constitutional: Negative.  Negative for chills and fever.  HENT: Negative.  Negative for congestion and sore throat.   Respiratory: Negative.  Negative for cough and shortness of breath.   Cardiovascular: Negative.  Negative for chest pain and palpitations.  Gastrointestinal: Negative.  Negative for abdominal pain, diarrhea, nausea and vomiting.  Genitourinary: Negative.  Negative for dysuria.  Musculoskeletal: Negative for myalgias.  Skin: Negative.  Negative for rash.  Neurological: Negative.  Negative for dizziness and headaches.  Endo/Heme/Allergies: Negative.   All other systems reviewed and are negative.  Today's Vitals   01/16/19 1408  BP: 105/63  Pulse: 94  Resp: 16  Temp: 98.6 F (37 C)  TempSrc: Oral  SpO2: 96%  Weight: 170  lb 12.8 oz (77.5 kg)  Height: 5\' 7"  (1.702 m)   Body mass index is 26.75 kg/m.   Physical Exam Vitals signs reviewed.  Constitutional:      Appearance: Normal appearance.  HENT:     Head: Normocephalic.  Eyes:     Extraocular Movements: Extraocular movements intact.     Pupils: Pupils are equal, round, and reactive to light.  Neck:     Musculoskeletal: Normal range of motion.  Cardiovascular:     Rate and Rhythm: Normal rate and regular rhythm.     Pulses: Normal pulses.     Heart sounds: Normal heart sounds.  Pulmonary:     Effort: Pulmonary effort is normal.     Breath sounds: Normal breath sounds.  Musculoskeletal: Normal range of motion.  Skin:    General: Skin is warm.     Capillary Refill: Capillary  refill takes less than 2 seconds.  Neurological:     General: No focal deficit present.     Mental Status: He is alert and oriented to person, place, and time.  Psychiatric:        Mood and Affect: Mood normal.        Behavior: Behavior normal.      ASSESSMENT & PLAN: Robert Dudley was seen today for immunizations.  Diagnoses and all orders for this visit:  Respiratory clearance examination, encounter for  Need for prophylactic vaccination and inoculation against influenza -     Flu Vaccine QUAD 36+ mos IM  Exposure to COVID-19 virus    Patient Instructions       If you have lab work done today you will be contacted with your lab results within the next 2 weeks.  If you have not heard from Robert Dudley then please contact us. The fastest way to get your results is to register for My Chart.   IF you received an x-ray today, you will receive an invoice from Robert Dudley. Please contact Robert Dudley at [phone] with questions or concerns regarding your invoice.   IF you received labwork today, you will receive an invoice from Robert Dudley. Please contact Robert Dudley at [phone] with questions or concerns regarding your invoice.   Our billing staff will not be able to assist you with questions regarding bills from these companies.  You will be contacted with the lab results as soon as they are available. The fastest way to get your results is to activate your My Chart account. Instructions are located on the last page of this paperwork. If you have not heard from [phone] regarding the results in 2 weeks, please contact this office.     Mantenimiento de [place], Male Adoptar un estilo de vida saludable y recibir atencin preventiva son importantes para promover la salud y Research officer, political party. Consulte al mdico sobre:  El esquema adecuado para hacerse pruebas y exmenes peridicos.  Cosas que puede hacer por su cuenta para prevenir enfermedades y  Robert Dudley. Qu debo saber sobre la dieta, el peso y el ejercicio? Consuma una dieta saludable   Consuma una dieta que incluya muchas verduras, frutas, productos lcteos con bajo contenido de Robert Dudley y [place]  No consuma muchos alimentos ricos en grasas slidas, azcares agregados o sodio. Mantenga un peso saludable El ndice de masa muscular [facility]) es una medida que puede utilizarse para identificar posibles problemas de [place]. Proporciona una estimacin de la grasa corporal basndose en el peso y la altura. Su mdico puede ayudarle a [place]  su [facility] y a Personnel officerlograr o Pharmacologistmantener un peso saludable. Haga ejercicio con regularidad Haga ejercicio con regularidad. Esta es una de las prcticas ms importantes que puede hacer por su salud. La mayora de los adultos deben seguir estas pautas:  Education officer, environmentalealizar, al menos, 150minutos de actividad fsica por semana. El ejercicio debe aumentar la frecuencia cardaca y Media plannerhacerlo transpirar (ejercicio de intensidad moderada).  Hacer ejercicios de fortalecimiento por lo Rite Aidmenos dos veces por semana. Agregue esto a su plan de ejercicio de intensidad moderada.  Pasar menos tiempo sentados. Incluso la actividad fsica ligera puede ser beneficiosa. Controle sus niveles de colesterol y lpidos en la sangre Comience a realizarse anlisis de lpidos y Oncologistcolesterol en la sangre a los 20aos y luego reptalos cada 5aos. Es posible que Insurance underwriternecesite controlar los niveles de colesterol con mayor frecuencia si:  Sus niveles de lpidos y colesterol son altos.  Es mayor de 40aos.  Presenta un alto riesgo de padecer enfermedades cardacas. Qu debo saber sobre las pruebas de deteccin del cncer? Muchos tipos de cncer pueden detectarse de manera temprana y, a menudo, pueden prevenirse. Segn su historia clnica y sus antecedentes familiares, es posible que deba realizarse pruebas de deteccin del cncer en diferentes edades. Esto puede incluir pruebas de deteccin de lo  siguiente:  Building services engineerCncer colorrectal.  Cncer de prstata.  Cncer de piel.  Cncer de pulmn. Qu debo saber sobre la enfermedad cardaca, la diabetes y la hipertensin arterial? Presin arterial y enfermedad cardaca  La hipertensin arterial causa enfermedades cardacas y Lesothoaumenta el riesgo de accidente cerebrovascular. Es ms probable que esto se manifieste en las personas que tienen lecturas de presin arterial alta, tienen ascendencia africana o tienen sobrepeso.  Hable con el mdico sobre sus valores de presin arterial deseados.  Hgase controlar la presin arterial: ? Cada 3 a 5 aos si tiene entre 18 y 5739 aos. ? Todos los aos si es mayor de [place]40aos.  Si tiene entre 65 y 5075 aos y es fumador o Insurance underwritersola fumar, pregntele al mdico si debe realizarse una prueba de deteccin de aneurisma artico abdominal (AAA) por nica vez. Diabetes Realcese exmenes de deteccin de la diabetes con regularidad. Este anlisis revisa el nivel de azcar en la sangre en [place]. Hgase las pruebas de deteccin:  Cada tresaos despus de los 45aos de edad si tiene un peso normal y un bajo riesgo de padecer diabetes.  Con ms frecuencia y a partir de [place] edad inferior si tiene sobrepeso o un alto riesgo de padecer diabetes. Qu debo saber sobre la prevencin de infecciones? Hepatitis B Si tiene un riesgo ms alto de contraer hepatitis B, debe someterse a un examen de deteccin de este virus. Hable con el mdico para averiguar si tiene riesgo de contraer la infeccin por hepatitis B. Hepatitis C Se recomienda un anlisis de [place] para:  Todos los que nacieron entre 1945 y [phone].  Todas las personas que tengan un riesgo de haber contrado hepatitis C. Enfermedades de transmisin sexual (ETS)  Debe realizarse pruebas de deteccin de ITS todos los aos, incluidas la gonorrea y la clamidia, si: ? Es sexualmente activo y es menor de 24aos. ? Es mayor de 24aos, y Public affairs consultantel mdico le informa que corre  riesgo de tener este tipo de infecciones. ? La actividad sexual ha cambiado desde que le hicieron la ltima prueba de deteccin y tiene un riesgo mayor de Warehouse managertener clamidia o Copygonorrea. Pregntele al mdico si usted tiene riesgo.  Pregntele al mdico si usted tiene un  alto riesgo de Primary school teacher VIH. El mdico tambin puede recomendarle un medicamento recetado para ayudar a evitar la infeccin por el VIH. Si elige tomar medicamentos para prevenir el VIH, primero debe [employer] de deteccin del VIH. Luego debe hacerse anlisis cada mientras est tomando los medicamentos. Siga estas instrucciones en su casa: Estilo de vida  No consuma ningn producto que contenga nicotina o tabaco, como cigarrillos, cigarrillos electrnicos y tabaco de Theatre manager. Si necesita ayuda para dejar de fumar, consulte al mdico.  No consuma drogas.  No comparta agujas.  Solicite ayuda a su mdico si necesita apoyo o informacin para abandonar las drogas. Consumo de alcohol  No beba alcohol si el mdico se lo prohbe.  Si bebe alcohol: ? Limite la cantidad que consume de 0 a 2 medidas por da. ? Est atento a la cantidad de alcohol que hay en las bebidas que toma. En [place], una medida equivale a una botella de cerveza de 12oz ( ), un vaso de vino de 5oz ( ) o un vaso de una bebida alcohlica de alta graduacin de 1oz (44ml). Instrucciones generales  Realcese los estudios de rutina de la salud, dentales y de Wellsite geologist.  Mantngase al da con las vacunas.  Infrmele a su mdico si: ? Se siente deprimido con frecuencia. ? Alguna vez ha sido vctima de [place] o no se siente seguro en su casa. Resumen  Adoptar un estilo de vida saludable y recibir atencin preventiva son importantes para promover la salud y Counsellor.  Siga las instrucciones del mdico acerca de una dieta saludable, el ejercicio y la realizacin de pruebas o exmenes para Hotel manager.  Siga las  instrucciones del mdico con respecto al control del colesterol y la presin arterial. Esta informacin no tiene Theme park manager el consejo del mdico. Asegrese de hacerle al mdico cualquier pregunta que tenga. Document Released: 09/18/2007 Document Revised: 04/12/2018 Document Reviewed: 04/12/2018 Elsevier Patient Education  2020 Elsevier Inc.      Edwina Barth, MD [facility]

## 2019-01-16 NOTE — Patient Instructions (Addendum)
   If you have lab work done today you will be contacted with your lab results within the next 2 weeks.  If you have not heard from us then please contact us. The fastest way to get your results is to register for My Chart.   IF you received an x-ray today, you will receive an invoice from [facility]. Please contact [facility] at [phone] with questions or concerns regarding your invoice.   IF you received labwork today, you will receive an invoice from [facility]. Please contact [facility] at [phone] with questions or concerns regarding your invoice.   Our billing staff will not be able to assist you with questions regarding bills from these companies.  You will be contacted with the lab results as soon as they are available. The fastest way to get your results is to activate your My Chart account. Instructions are located on the last page of this paperwork. If you have not heard from us regarding the results in 2 weeks, please contact this office.     Mantenimiento de la salud en los hombres Health Maintenance, Male Adoptar un estilo de vida saludable y recibir atencin preventiva son importantes para promover la salud y el bienestar. Consulte al mdico sobre:  El esquema adecuado para hacerse pruebas y exmenes peridicos.  Cosas que puede hacer por su cuenta para prevenir enfermedades y mantenerse sano. Qu debo saber sobre la dieta, el peso y el ejercicio? Consuma una dieta saludable   Consuma una dieta que incluya muchas verduras, frutas, productos lcteos con bajo contenido de grasa y protenas magras.  No consuma muchos alimentos ricos en grasas slidas, azcares agregados o sodio. Mantenga un peso saludable El ndice de masa muscular (IMC) es una medida que puede utilizarse para identificar posibles problemas de peso. Proporciona una estimacin de la grasa corporal basndose en el peso y la altura. Su mdico puede ayudarle a determinar su IMC y a  lograr o mantener un peso saludable. Haga ejercicio con regularidad Haga ejercicio con regularidad. Esta es una de las prcticas ms importantes que puede hacer por su salud. La mayora de los adultos deben seguir estas pautas:  Realizar, al menos, 150minutos de actividad fsica por semana. El ejercicio debe aumentar la frecuencia cardaca y hacerlo transpirar (ejercicio de intensidad moderada).  Hacer ejercicios de fortalecimiento por lo menos dos veces por semana. Agregue esto a su plan de ejercicio de intensidad moderada.  Pasar menos tiempo sentados. Incluso la actividad fsica ligera puede ser beneficiosa. Controle sus niveles de colesterol y lpidos en la sangre Comience a realizarse anlisis de lpidos y colesterol en la sangre a los 20aos y luego reptalos cada 5aos. Es posible que necesite controlar los niveles de colesterol con mayor frecuencia si:  Sus niveles de lpidos y colesterol son altos.  Es mayor de 40aos.  Presenta un alto riesgo de padecer enfermedades cardacas. Qu debo saber sobre las pruebas de deteccin del cncer? Muchos tipos de cncer pueden detectarse de manera temprana y, a menudo, pueden prevenirse. Segn su historia clnica y sus antecedentes familiares, es posible que deba realizarse pruebas de deteccin del cncer en diferentes edades. Esto puede incluir pruebas de deteccin de lo siguiente:  Cncer colorrectal.  Cncer de prstata.  Cncer de piel.  Cncer de pulmn. Qu debo saber sobre la enfermedad cardaca, la diabetes y la hipertensin arterial? Presin arterial y enfermedad cardaca  La hipertensin arterial causa enfermedades cardacas y aumenta el riesgo de accidente cerebrovascular. Es ms probable que   esto se manifieste en las personas que tienen lecturas de presin arterial alta, tienen ascendencia africana o tienen sobrepeso.  Hable con el mdico sobre sus valores de presin arterial deseados.  Hgase controlar la presin  arterial: ? Cada 3 a 5 aos si tiene entre 18 y 39 aos. ? Todos los aos si es mayor de 40aos.  Si tiene entre 65 y 75 aos y es fumador o sola fumar, pregntele al mdico si debe realizarse una prueba de deteccin de aneurisma artico abdominal (AAA) por nica vez. Diabetes Realcese exmenes de deteccin de la diabetes con regularidad. Este anlisis revisa el nivel de azcar en la sangre en ayunas. Hgase las pruebas de deteccin:  Cada tresaos despus de los 45aos de edad si tiene un peso normal y un bajo riesgo de padecer diabetes.  Con ms frecuencia y a partir de una edad inferior si tiene sobrepeso o un alto riesgo de padecer diabetes. Qu debo saber sobre la prevencin de infecciones? Hepatitis B Si tiene un riesgo ms alto de contraer hepatitis B, debe someterse a un examen de deteccin de este virus. Hable con el mdico para averiguar si tiene riesgo de contraer la infeccin por hepatitis B. Hepatitis C Se recomienda un anlisis de sangre para:  Todos los que nacieron entre 1945 y 1965.  Todas las personas que tengan un riesgo de haber contrado hepatitis C. Enfermedades de transmisin sexual (ETS)  Debe realizarse pruebas de deteccin de ITS todos los aos, incluidas la gonorrea y la clamidia, si: ? Es sexualmente activo y es menor de 24aos. ? Es mayor de 24aos, y el mdico le informa que corre riesgo de tener este tipo de infecciones. ? La actividad sexual ha cambiado desde que le hicieron la ltima prueba de deteccin y tiene un riesgo mayor de tener clamidia o gonorrea. Pregntele al mdico si usted tiene riesgo.  Pregntele al mdico si usted tiene un alto riesgo de contraer VIH. El mdico tambin puede recomendarle un medicamento recetado para ayudar a evitar la infeccin por el VIH. Si elige tomar medicamentos para prevenir el VIH, primero debe hacerse los anlisis de deteccin del VIH. Luego debe hacerse anlisis cada 3meses mientras est tomando los  medicamentos. Siga estas instrucciones en su casa: Estilo de vida  No consuma ningn producto que contenga nicotina o tabaco, como cigarrillos, cigarrillos electrnicos y tabaco de mascar. Si necesita ayuda para dejar de fumar, consulte al mdico.  No consuma drogas.  No comparta agujas.  Solicite ayuda a su mdico si necesita apoyo o informacin para abandonar las drogas. Consumo de alcohol  No beba alcohol si el mdico se lo prohbe.  Si bebe alcohol: ? Limite la cantidad que consume de 0 a 2 medidas por da. ? Est atento a la cantidad de alcohol que hay en las bebidas que toma. En [place], una medida equivale a una botella de cerveza de 12oz (355ml), un vaso de vino de 5oz (148ml) o un vaso de una bebida alcohlica de alta graduacin de 1oz (44ml). Instrucciones generales  Realcese los estudios de rutina de la salud, dentales y de la vista.  Mantngase al da con las vacunas.  Infrmele a su mdico si: ? Se siente deprimido con frecuencia. ? Alguna vez ha sido vctima de maltrato o no se siente seguro en su casa. Resumen  Adoptar un estilo de vida saludable y recibir atencin preventiva son importantes para promover la salud y el bienestar.  Siga las instrucciones del mdico acerca de   una dieta saludable, el ejercicio y la realizacin de pruebas o exmenes para detectar enfermedades.  Siga las instrucciones del mdico con respecto al control del colesterol y la presin arterial. Esta informacin no tiene como fin reemplazar el consejo del mdico. Asegrese de hacerle al mdico cualquier pregunta que tenga. Document Released: 09/18/2007 Document Revised: 04/12/2018 Document Reviewed: 04/12/2018 Elsevier Patient Education  2020 Elsevier Inc.  

## 2019-04-26 ENCOUNTER — Telehealth: Payer: Self-pay | Admitting: Emergency Medicine

## 2019-04-26 ENCOUNTER — Encounter: Payer: Self-pay | Admitting: Emergency Medicine

## 2019-04-26 NOTE — Telephone Encounter (Signed)
Positive Covid infection.  Covid precautions given to him and his wife.  ED precautions given.  Stable.  Tolerating illness well.

## 2019-08-21 ENCOUNTER — Encounter: Payer: Self-pay | Admitting: Emergency Medicine

## 2019-08-21 ENCOUNTER — Ambulatory Visit (INDEPENDENT_AMBULATORY_CARE_PROVIDER_SITE_OTHER): Payer: BC Managed Care – PPO

## 2019-08-21 ENCOUNTER — Other Ambulatory Visit: Payer: Self-pay

## 2019-08-21 ENCOUNTER — Ambulatory Visit (INDEPENDENT_AMBULATORY_CARE_PROVIDER_SITE_OTHER): Payer: BC Managed Care – PPO | Admitting: Emergency Medicine

## 2019-08-21 VITALS — BP 116/75 | HR 62 | Temp 98.6°F | Ht 67.0 in | Wt 174.0 lb

## 2019-08-21 DIAGNOSIS — R7303 Prediabetes: Secondary | ICD-10-CM | POA: Diagnosis not present

## 2019-08-21 DIAGNOSIS — R059 Cough, unspecified: Secondary | ICD-10-CM

## 2019-08-21 DIAGNOSIS — K219 Gastro-esophageal reflux disease without esophagitis: Secondary | ICD-10-CM | POA: Diagnosis not present

## 2019-08-21 DIAGNOSIS — R05 Cough: Secondary | ICD-10-CM

## 2019-08-21 NOTE — Progress Notes (Signed)
Robert Dudley 59 y.o.   Chief Complaint  Patient presents with  . request for chest x-ray    covid 19 infection 04/2019  . pain in joints of both hands    also pain in both feet     HISTORY OF PRESENT ILLNESS: This is a 59 y.o. male concerned about his lungs.  Has occasional dry cough. Medical history positive for GERD and prediabetes.  Was also evaluated by urologist for elevated PSA and told his prostate is okay.  Otherwise unremarkable. Fully vaccinated against Covid. 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 Has occasional pains to joints of both hands and feet. No other complaints or medical concerns today.  HPI   Prior to Admission medications   Medication Sig Start Date End Date Taking? Authorizing Provider  acetaminophen (TYLENOL) 325 MG tablet Take 650 mg by mouth every 6 (six) hours as needed.   Yes [provider]  COLLAGEN PO Take by mouth.   Yes [provider]  fexofenadine (ALLEGRA) 180 MG tablet Take 180 mg by mouth daily.    [provider]    No Known Allergies  Patient Active Problem List   Diagnosis Date Noted  . Gastroesophageal reflux disease without esophagitis 04/03/2018    History reviewed. No pertinent past medical history.  Past Surgical History:  Procedure Laterality Date  . APPENDECTOMY  1983    Social History   Socioeconomic History  . Marital status: Married    Spouse name: Not on file  . Number of children: Not on file  . Years of education: Not on file  . Highest education level: Not on file  Occupational History  . Not on file  Tobacco Use  . Smoking status: Never Smoker  . Smokeless tobacco: Never Used  Substance and Sexual Activity  . Alcohol use: Yes    Comment: sometimes  . Drug use: Never  . Sexual activity: Not on file  Other Topics Concern  . Not on file  Social History Narrative  . Not on file   Social Determinants of Health   Financial Resource Strain:   . Difficulty of Paying  Living Expenses:   Food Insecurity:   . Worried About Programme researcher, broadcasting/film/video in the Last Year:   . Barista in the Last Year:   Transportation Needs:   . Freight forwarder (Medical):   Marland Kitchen Lack of Transportation (Non-Medical):   Physical Activity:   . Days of Exercise per Week:   . Minutes of Exercise per Session:   Stress:   . Feeling of Stress :   Social Connections:   . Frequency of Communication with Friends and Family:   . Frequency of Social Gatherings with Friends and Family:   . Attends Religious Services:   . Active Member of Clubs or Organizations:   . Attends Banker Meetings:   Marland Kitchen Marital Status:   Intimate Partner Violence:   . Fear of Current or Ex-Partner:   . Emotionally Abused:   Marland Kitchen Physically Abused:   . Sexually Abused:     Family History  Problem Relation Age of Onset  . Arthritis Mother   . Heart disease Father   . Arthritis Sister      Review of Systems  Constitutional: Negative.  Negative for chills and fever.  HENT: Negative.  Negative for congestion and sore throat.   Respiratory: Positive for cough. Negative for shortness of breath.   Cardiovascular: Negative.  Negative for chest pain  and palpitations.  Gastrointestinal: Negative.  Negative for abdominal pain, blood in stool, diarrhea, melena, nausea and vomiting.  Genitourinary: Negative.  Negative for dysuria and hematuria.  Musculoskeletal: Positive for joint pain.  Skin: Negative.  Negative for rash.  Neurological: Negative.  Negative for dizziness and headaches.  All other systems reviewed and are negative.  Today's Vitals   08/21/19 1014  BP: 116/75  Pulse: 62  Temp: 98.6 F (37 C)  SpO2: 98%  Weight: 174 lb (78.9 kg)  Height: 5\' 7"  (1.702 m)   Body mass index is 27.25 kg/m.   Physical Exam Vitals reviewed.  Constitutional:      Appearance: Normal appearance.  HENT:     Head: Normocephalic.  Eyes:     Extraocular Movements: Extraocular movements  intact.     Conjunctiva/sclera: Conjunctivae normal.     Pupils: Pupils are equal, round, and reactive to light.  Cardiovascular:     Rate and Rhythm: Normal rate and regular rhythm.     Pulses: Normal pulses.     Heart sounds: Normal heart sounds.  Pulmonary:     Effort: Pulmonary effort is normal.     Breath sounds: Normal breath sounds.  Abdominal:     General: There is no distension.     Palpations: Abdomen is soft.     Tenderness: There is no abdominal tenderness.  Musculoskeletal:        General: Normal range of motion.     Cervical back: Normal range of motion and neck supple.  Skin:    General: Skin is warm and dry.     Capillary Refill: Capillary refill takes less than 2 seconds.  Neurological:     General: No focal deficit present.     Mental Status: He is alert and oriented to person, place, and time.  Psychiatric:        Mood and Affect: Mood normal.        Behavior: Behavior normal.    DG Chest 2 View  Result Date: 08/21/2019 CLINICAL DATA:  Cough EXAM: CHEST - 2 VIEW COMPARISON:  06/04/2014 FINDINGS: Normal heart size and mediastinal contours. No acute infiltrate or edema. Calcified granulomas on the left, stable. No effusion or pneumothorax. No acute osseous findings. IMPRESSION: No evidence of active disease. Electronically Signed   By: 08/04/2014 M.D.   On: 08/21/2019 10:41   A total of 30 minutes was spent with the patient, greater than 50% of which was in counseling/coordination of care regarding chronic medical issues, review of most recent office visit notes, review of most recent blood work results, review of chest x-ray with the patient, diet and nutrition, health maintenance issues, prognosis and need for follow-up.   ASSESSMENT & PLAN: Robert Dudley was seen today for request for chest x-ray and pain in joints of both hands.  Diagnoses and all orders for this visit:  Prediabetes -     Comprehensive metabolic panel -     Hemoglobin A1c -     Lipid  panel  Gastroesophageal reflux disease without esophagitis  Cough -     DG Chest 2 View    Patient Instructions       If you have lab work done today you will be contacted with your lab results within the next 2 weeks.  If you have not heard from Robert Nestle then please contact us. The fastest way to get your results is to register for My Chart.   IF you received an x-ray today, you will  receive an Pharmacologist from [facility]. Please contact [facility] at [phone] with questions or concerns regarding your invoice.   IF you received labwork today, you will receive an invoice from [facility]. Please contact [facility] at [phone] with questions or concerns regarding your invoice.   Our billing staff will not be able to assist you with questions regarding bills from these companies.  You will be contacted with the lab results as soon as they are available. The fastest way to get your results is to activate your My Chart account. Instructions are located on the last page of this paperwork. If you have not heard from [place] regarding the results in 2 weeks, please contact this office.      Mantenimiento de Teacher, English as a foreign language, Male Adoptar un estilo de vida saludable y recibir atencin preventiva son importantes para promover la salud y Musician. Consulte al mdico sobre:  El esquema adecuado para hacerse pruebas y exmenes peridicos.  Cosas que puede hacer por su cuenta para prevenir enfermedades y [place] sano. Qu debo saber sobre la dieta, el peso y el ejercicio? Consuma una dieta saludable   Consuma una dieta que incluya muchas verduras, frutas, productos lcteos con bajo contenido de [place] y Advertising account planner.  No consuma muchos alimentos ricos en grasas slidas, azcares agregados o sodio. Mantenga un peso saludable El ndice de masa muscular [facility]) es una medida que puede utilizarse para identificar posibles problemas de [place].  Proporciona una estimacin de la grasa corporal basndose en el peso y la altura. Su mdico puede ayudarle a Radiation protection practitioner [place] y a Scientist, forensic o Theatre manager un peso saludable. Haga ejercicio con regularidad Haga ejercicio con regularidad. Esta es una de las prcticas ms importantes que puede hacer por su salud. La mayora de los adultos deben seguir estas pautas:  Optometrist, al menos, 179minutos de actividad fsica por semana. El ejercicio debe aumentar la frecuencia cardaca y Nature conservation officer transpirar (ejercicio de intensidad moderada).  Hacer ejercicios de fortalecimiento por lo [employer] por semana. Agregue esto a su plan de ejercicio de intensidad moderada.  Pasar menos tiempo sentados. Incluso la actividad fsica ligera puede ser beneficiosa. Controle sus niveles de colesterol y lpidos en la sangre Comience a realizarse anlisis de lpidos y Research officer, trade union en la sangre a los 20aos y luego reptalos cada 5aos. Es posible que Automotive engineer los niveles de colesterol con mayor frecuencia si:  Sus niveles de lpidos y colesterol son altos.  Es mayor de 40aos.  Presenta un alto riesgo de padecer enfermedades cardacas. Qu debo saber sobre las pruebas de deteccin del cncer? Muchos tipos de cncer pueden detectarse de manera temprana y, a menudo, pueden prevenirse. Segn su historia clnica y sus antecedentes familiares, es posible que deba realizarse pruebas de deteccin del cncer en diferentes edades. Esto puede incluir pruebas de deteccin de lo siguiente:  Surveyor, minerals.  Cncer de prstata.  Cncer de piel.  Cncer de pulmn. Qu debo saber sobre la enfermedad cardaca, la diabetes y la hipertensin arterial? Presin arterial y enfermedad cardaca  La hipertensin arterial causa enfermedades cardacas y [place] el riesgo de accidente cerebrovascular. Es ms probable que esto se manifieste en las personas que tienen lecturas de presin arterial alta, tienen ascendencia  africana o tienen sobrepeso.  Hable con el mdico sobre sus valores de presin arterial deseados.  Hgase controlar la presin arterial: ? Cada 3 a 5 aos si tiene entre 18 y 2 aos. ? Todos los aos si es  mayor de 73aos.  Si tiene entre 65 y 15 aos y es fumador o Insurance underwriter, pregntele al mdico si debe realizarse una prueba de deteccin de aneurisma artico abdominal (AAA) por nica vez. Diabetes Realcese exmenes de deteccin de la diabetes con regularidad. Este anlisis revisa el nivel de azcar en la sangre en [place]. Hgase las pruebas de deteccin:  Cada tresaos despus de los 45aos de edad si tiene un peso normal y un bajo riesgo de padecer diabetes.  Con ms frecuencia y a partir de [place] edad inferior si tiene sobrepeso o un alto riesgo de padecer diabetes. Qu debo saber sobre la prevencin de infecciones? Hepatitis B Si tiene un riesgo ms alto de contraer hepatitis B, debe someterse a un examen de deteccin de este virus. Hable con el mdico para averiguar si tiene riesgo de contraer la infeccin por hepatitis B. Hepatitis C Se recomienda un anlisis de [place] para:  Todos los que nacieron entre 1945 y [phone].  Todas las personas que tengan un riesgo de haber contrado hepatitis C. Enfermedades de transmisin sexual (ETS)  Debe realizarse pruebas de deteccin de ITS todos los aos, incluidas la gonorrea y la clamidia, si: ? Es sexualmente activo y es menor de 24aos. ? Es mayor de 24aos, y Public affairs consultant informa que corre riesgo de tener este tipo de infecciones. ? La actividad sexual ha cambiado desde que le hicieron la ltima prueba de deteccin y tiene un riesgo mayor de Warehouse manager clamidia o Copy. Pregntele al mdico si usted tiene riesgo.  Pregntele al mdico si usted tiene un alto riesgo de Primary school teacher VIH. El mdico tambin puede recomendarle un medicamento recetado para ayudar a evitar la infeccin por el VIH. Si elige tomar medicamentos para prevenir el VIH,  primero debe [employer] de deteccin del VIH. Luego debe hacerse anlisis cada mientras est tomando los medicamentos. Siga estas instrucciones en su casa: Estilo de vida  No consuma ningn producto que contenga nicotina o tabaco, como cigarrillos, cigarrillos electrnicos y tabaco de Theatre manager. Si necesita ayuda para dejar de fumar, consulte al mdico.  No consuma drogas.  No comparta agujas.  Solicite ayuda a su mdico si necesita apoyo o informacin para abandonar las drogas. Consumo de alcohol  No beba alcohol si el mdico se lo prohbe.  Si bebe alcohol: ? Limite la cantidad que consume de 0 a 2 medidas por da. ? Est atento a la cantidad de alcohol que hay en las bebidas que toma. En los [place], una medida equivale a una botella de cerveza de 12oz ( ), un vaso de vino de 5oz ( ) o un vaso de una bebida alcohlica de alta graduacin de 1oz (27ml). Instrucciones generales  Realcese los estudios de rutina de la salud, dentales y de Wellsite geologist.  Mantngase al da con las vacunas.  Infrmele a su mdico si: ? Se siente deprimido con frecuencia. ? Alguna vez ha sido vctima de [place] o no se siente seguro en su casa. Resumen  Adoptar un estilo de vida saludable y recibir atencin preventiva son importantes para promover la salud y Counsellor.  Siga las instrucciones del mdico acerca de una dieta saludable, el ejercicio y la realizacin de pruebas o exmenes para Hotel manager.  Siga las instrucciones del mdico con respecto al control del colesterol y la presin arterial. Esta informacin no tiene Theme park manager el consejo del mdico. Asegrese de hacerle al mdico cualquier pregunta que tenga. Document Revised: 04/12/2018 Document Reviewed: 04/12/2018  Elsevier Patient Education  2020 [employer].      Edwina Barth, MD [facility]

## 2019-08-21 NOTE — Patient Instructions (Addendum)
   If you have lab work done today you will be contacted with your lab results within the next 2 weeks.  If you have not heard from us then please contact us. The fastest way to get your results is to register for My Chart.   IF you received an x-ray today, you will receive an invoice from [facility]. Please contact [facility] at [phone] with questions or concerns regarding your invoice.   IF you received labwork today, you will receive an invoice from [facility]. Please contact [facility] at [phone] with questions or concerns regarding your invoice.   Our billing staff will not be able to assist you with questions regarding bills from these companies.  You will be contacted with the lab results as soon as they are available. The fastest way to get your results is to activate your My Chart account. Instructions are located on the last page of this paperwork. If you have not heard from us regarding the results in 2 weeks, please contact this office.       Mantenimiento de la salud en los hombres Health Maintenance, Male Adoptar un estilo de vida saludable y recibir atencin preventiva son importantes para promover la salud y el bienestar. Consulte al mdico sobre:  El esquema adecuado para hacerse pruebas y exmenes peridicos.  Cosas que puede hacer por su cuenta para prevenir enfermedades y mantenerse sano. Qu debo saber sobre la dieta, el peso y el ejercicio? Consuma una dieta saludable   Consuma una dieta que incluya muchas verduras, frutas, productos lcteos con bajo contenido de grasa y protenas magras.  No consuma muchos alimentos ricos en grasas slidas, azcares agregados o sodio. Mantenga un peso saludable El ndice de masa muscular ([facility]) es una medida que puede utilizarse para identificar posibles problemas de peso. Proporciona una estimacin de la grasa corporal basndose en el peso y la altura. Su mdico puede ayudarle a determinar su IMC y  a lograr o mantener un peso saludable. Haga ejercicio con regularidad Haga ejercicio con regularidad. Esta es una de las prcticas ms importantes que puede hacer por su salud. La mayora de los adultos deben seguir estas pautas:  Realizar, al menos, 150minutos de actividad fsica por semana. El ejercicio debe aumentar la frecuencia cardaca y hacerlo transpirar (ejercicio de intensidad moderada).  Hacer ejercicios de fortalecimiento por lo menos dos veces por semana. Agregue esto a su plan de ejercicio de intensidad moderada.  Pasar menos tiempo sentados. Incluso la actividad fsica ligera puede ser beneficiosa. Controle sus niveles de colesterol y lpidos en la sangre Comience a realizarse anlisis de lpidos y colesterol en la sangre a los 20aos y luego reptalos cada 5aos. Es posible que necesite controlar los niveles de colesterol con mayor frecuencia si:  Sus niveles de lpidos y colesterol son altos.  Es mayor de 40aos.  Presenta un alto riesgo de padecer enfermedades cardacas. Qu debo saber sobre las pruebas de deteccin del cncer? Muchos tipos de cncer pueden detectarse de manera temprana y, a menudo, pueden prevenirse. Segn su historia clnica y sus antecedentes familiares, es posible que deba realizarse pruebas de deteccin del cncer en diferentes edades. Esto puede incluir pruebas de deteccin de lo siguiente:  Cncer colorrectal.  Cncer de prstata.  Cncer de piel.  Cncer de pulmn. Qu debo saber sobre la enfermedad cardaca, la diabetes y la hipertensin arterial? Presin arterial y enfermedad cardaca  La hipertensin arterial causa enfermedades cardacas y aumenta el riesgo de accidente cerebrovascular. Es ms   probable que esto se manifieste en las personas que tienen lecturas de presin arterial alta, tienen ascendencia africana o tienen sobrepeso.  Hable con el mdico sobre sus valores de presin arterial deseados.  Hgase controlar la presin  arterial: ? Cada 3 a 5 aos si tiene entre 18 y 39 aos. ? Todos los aos si es mayor de 40aos.  Si tiene entre 65 y 75 aos y es fumador o sola fumar, pregntele al mdico si debe realizarse una prueba de deteccin de aneurisma artico abdominal (AAA) por nica vez. Diabetes Realcese exmenes de deteccin de la diabetes con regularidad. Este anlisis revisa el nivel de azcar en la sangre en ayunas. Hgase las pruebas de deteccin:  Cada tresaos despus de los 45aos de edad si tiene un peso normal y un bajo riesgo de padecer diabetes.  Con ms frecuencia y a partir de una edad inferior si tiene sobrepeso o un alto riesgo de padecer diabetes. Qu debo saber sobre la prevencin de infecciones? Hepatitis B Si tiene un riesgo ms alto de contraer hepatitis B, debe someterse a un examen de deteccin de este virus. Hable con el mdico para averiguar si tiene riesgo de contraer la infeccin por hepatitis B. Hepatitis C Se recomienda un anlisis de sangre para:  Todos los que nacieron entre 1945 y 1965.  Todas las personas que tengan un riesgo de haber contrado hepatitis C. Enfermedades de transmisin sexual (ETS)  Debe realizarse pruebas de deteccin de ITS todos los aos, incluidas la gonorrea y la clamidia, si: ? Es sexualmente activo y es menor de 24aos. ? Es mayor de 24aos, y el mdico le informa que corre riesgo de tener este tipo de infecciones. ? La actividad sexual ha cambiado desde que le hicieron la ltima prueba de deteccin y tiene un riesgo mayor de tener clamidia o gonorrea. Pregntele al mdico si usted tiene riesgo.  Pregntele al mdico si usted tiene un alto riesgo de contraer VIH. El mdico tambin puede recomendarle un medicamento recetado para ayudar a evitar la infeccin por el VIH. Si elige tomar medicamentos para prevenir el VIH, primero debe hacerse los anlisis de deteccin del VIH. Luego debe hacerse anlisis cada 3meses mientras est tomando los  medicamentos. Siga estas instrucciones en su casa: Estilo de vida  No consuma ningn producto que contenga nicotina o tabaco, como cigarrillos, cigarrillos electrnicos y tabaco de mascar. Si necesita ayuda para dejar de fumar, consulte al mdico.  No consuma drogas.  No comparta agujas.  Solicite ayuda a su mdico si necesita apoyo o informacin para abandonar las drogas. Consumo de alcohol  No beba alcohol si el mdico se lo prohbe.  Si bebe alcohol: ? Limite la cantidad que consume de 0 a 2 medidas por da. ? Est atento a la cantidad de alcohol que hay en las bebidas que toma. En los [place], una medida equivale a una botella de cerveza de 12oz (355ml), un vaso de vino de 5oz (148ml) o un vaso de una bebida alcohlica de alta graduacin de 1oz (44ml). Instrucciones generales  Realcese los estudios de rutina de la salud, dentales y de la vista.  Mantngase al da con las vacunas.  Infrmele a su mdico si: ? Se siente deprimido con frecuencia. ? Alguna vez ha sido vctima de maltrato o no se siente seguro en su casa. Resumen  Adoptar un estilo de vida saludable y recibir atencin preventiva son importantes para promover la salud y el bienestar.  Siga las instrucciones del mdico   acerca de una dieta saludable, el ejercicio y la realizacin de pruebas o exmenes para detectar enfermedades.  Siga las instrucciones del mdico con respecto al control del colesterol y la presin arterial. Esta informacin no tiene como fin reemplazar el consejo del mdico. Asegrese de hacerle al mdico cualquier pregunta que tenga. Document Revised: 04/12/2018 Document Reviewed: 04/12/2018 Elsevier Patient Education  2020 Elsevier Inc.  

## 2019-08-22 ENCOUNTER — Encounter: Payer: Self-pay | Admitting: Emergency Medicine

## 2019-08-22 LAB — COMPREHENSIVE METABOLIC PANEL
ALT: 21 IU/L (ref 0–44)
AST: 21 IU/L (ref 0–40)
Albumin/Globulin Ratio: 1.8 (ref 1.2–2.2)
Albumin: 4.6 g/dL (ref 3.8–4.9)
Alkaline Phosphatase: 79 IU/L (ref 48–121)
BUN/Creatinine Ratio: 22 — ABNORMAL HIGH (ref 9–20)
BUN: 21 mg/dL (ref 6–24)
Bilirubin Total: 0.4 mg/dL (ref 0.0–1.2)
CO2: 24 mmol/L (ref 20–29)
Calcium: 9.2 mg/dL (ref 8.7–10.2)
Chloride: 103 mmol/L (ref 96–106)
Creatinine, Ser: 0.97 mg/dL (ref 0.76–1.27)
GFR calc Af Amer: 100 mL/min/{1.73_m2} (ref >59)
GFR calc non Af Amer: 86 mL/min/{1.73_m2} (ref >59)
Globulin, Total: 2.5 g/dL (ref 1.5–4.5)
Glucose: 114 mg/dL — ABNORMAL HIGH (ref 65–99)
Potassium: 4.5 mmol/L (ref 3.5–5.2)
Sodium: 140 mmol/L (ref 134–144)
Total Protein: 7.1 g/dL (ref 6.0–8.5)

## 2019-08-22 LAB — LIPID PANEL
Chol/HDL Ratio: 3.3 ratio (ref 0.0–5.0)
Cholesterol, Total: 177 mg/dL (ref 100–199)
HDL: 54 mg/dL (ref >39)
LDL Chol Calc (NIH): 97 mg/dL (ref 0–99)
Triglycerides: 151 mg/dL — ABNORMAL HIGH (ref 0–149)
VLDL Cholesterol Cal: 26 mg/dL (ref 5–40)

## 2019-08-22 LAB — HEMOGLOBIN A1C
Est. average glucose Bld gHb Est-mCnc: 123 mg/dL
Hgb A1c MFr Bld: 5.9 % — ABNORMAL HIGH (ref 4.8–5.6)

## 2019-09-07 ENCOUNTER — Other Ambulatory Visit: Payer: Self-pay

## 2019-09-07 ENCOUNTER — Ambulatory Visit: Payer: Self-pay

## 2019-09-07 ENCOUNTER — Other Ambulatory Visit: Payer: Self-pay | Admitting: Family Medicine

## 2019-09-07 DIAGNOSIS — M79645 Pain in left finger(s): Secondary | ICD-10-CM

## 2020-09-30 ENCOUNTER — Other Ambulatory Visit: Payer: Self-pay

## 2020-09-30 ENCOUNTER — Ambulatory Visit (INDEPENDENT_AMBULATORY_CARE_PROVIDER_SITE_OTHER): Payer: BC Managed Care – PPO | Admitting: Emergency Medicine

## 2020-09-30 ENCOUNTER — Encounter: Payer: Self-pay | Admitting: Emergency Medicine

## 2020-09-30 VITALS — BP 116/62 | HR 71 | Temp 98.2°F | Ht 67.0 in | Wt 175.6 lb

## 2020-09-30 DIAGNOSIS — Z130 Encounter for screening for diseases of the blood and blood-forming organs and certain disorders involving the immune mechanism: Secondary | ICD-10-CM

## 2020-09-30 DIAGNOSIS — Z13228 Encounter for screening for other metabolic disorders: Secondary | ICD-10-CM | POA: Diagnosis not present

## 2020-09-30 DIAGNOSIS — Z1159 Encounter for screening for other viral diseases: Secondary | ICD-10-CM

## 2020-09-30 DIAGNOSIS — Z13220 Encounter for screening for lipoid disorders: Secondary | ICD-10-CM

## 2020-09-30 DIAGNOSIS — Z1329 Encounter for screening for other suspected endocrine disorder: Secondary | ICD-10-CM

## 2020-09-30 DIAGNOSIS — R7303 Prediabetes: Secondary | ICD-10-CM

## 2020-09-30 DIAGNOSIS — Z0000 Encounter for general adult medical examination without abnormal findings: Secondary | ICD-10-CM

## 2020-09-30 DIAGNOSIS — Z114 Encounter for screening for human immunodeficiency virus [HIV]: Secondary | ICD-10-CM

## 2020-09-30 DIAGNOSIS — Z8719 Personal history of other diseases of the digestive system: Secondary | ICD-10-CM

## 2020-09-30 LAB — LIPID PANEL
Cholesterol: 179 mg/dL (ref 0–200)
HDL: 52.4 mg/dL (ref >39.00)
LDL Cholesterol: 100 mg/dL — ABNORMAL HIGH (ref 0–99)
NonHDL: 126.55
Total CHOL/HDL Ratio: 3
Triglycerides: 134 mg/dL (ref 0.0–149.0)
VLDL: 26.8 mg/dL (ref 0.0–40.0)

## 2020-09-30 LAB — CBC WITH DIFFERENTIAL/PLATELET
Basophils Absolute: 0.1 10*3/uL (ref 0.0–0.1)
Basophils Relative: 0.9 % (ref 0.0–3.0)
Eosinophils Absolute: 0 10*3/uL (ref 0.0–0.7)
Eosinophils Relative: 0.3 % (ref 0.0–5.0)
HCT: 43.3 % (ref 39.0–52.0)
Hemoglobin: 14.7 g/dL (ref 13.0–17.0)
Lymphocytes Relative: 18.1 % (ref 12.0–46.0)
Lymphs Abs: 1.2 10*3/uL (ref 0.7–4.0)
MCHC: 34 g/dL (ref 30.0–36.0)
MCV: 93.7 fl (ref 78.0–100.0)
Monocytes Absolute: 0.4 10*3/uL (ref 0.1–1.0)
Monocytes Relative: 5.3 % (ref 3.0–12.0)
Neutro Abs: 5 10*3/uL (ref 1.4–7.7)
Neutrophils Relative %: 75.4 % (ref 43.0–77.0)
Platelets: 201 10*3/uL (ref 150.0–400.0)
RBC: 4.62 Mil/uL (ref 4.22–5.81)
RDW: 13.2 % (ref 11.5–15.5)
WBC: 6.7 10*3/uL (ref 4.0–10.5)

## 2020-09-30 LAB — COMPREHENSIVE METABOLIC PANEL WITH GFR
ALT: 19 U/L (ref 0–53)
AST: 18 U/L (ref 0–37)
Albumin: 4.4 g/dL (ref 3.5–5.2)
Alkaline Phosphatase: 75 U/L (ref 39–117)
BUN: 22 mg/dL (ref 6–23)
CO2: 27 meq/L (ref 19–32)
Calcium: 9.5 mg/dL (ref 8.4–10.5)
Chloride: 103 meq/L (ref 96–112)
Creatinine, Ser: 1.07 mg/dL (ref 0.40–1.50)
GFR: 76.32 mL/min
Glucose, Bld: 119 mg/dL — ABNORMAL HIGH (ref 70–99)
Potassium: 4.9 meq/L (ref 3.5–5.1)
Sodium: 137 meq/L (ref 135–145)
Total Bilirubin: 0.4 mg/dL (ref 0.2–1.2)
Total Protein: 7.6 g/dL (ref 6.0–8.3)

## 2020-09-30 LAB — TSH: TSH: 0.53 u[IU]/mL (ref 0.35–4.50)

## 2020-09-30 LAB — HEMOGLOBIN A1C: Hgb A1c MFr Bld: 6.4 % (ref 4.6–6.5)

## 2020-09-30 NOTE — Patient Instructions (Addendum)
Health Maintenance, Male Adopting a healthy lifestyle and getting preventive care are important in promoting health and wellness. Ask your health care provider about: The right schedule for you to have regular tests and exams. Things you can do on your own to prevent diseases and keep yourself healthy. What should I know about diet, weight, and exercise? Eat a healthy diet  Eat a diet that includes plenty of vegetables, fruits, low-fat dairy products, and lean protein. Do not eat a lot of foods that are high in solid fats, added sugars, or sodium.  Maintain a healthy weight Body mass index (BMI) is a measurement that can be used to identify possible weight problems. It estimates body fat based on height and weight. Your health care provider can help determine your BMI and help you achieve or maintain ahealthy weight. Get regular exercise Get regular exercise. This is one of the most important things you can do for your health. Most adults should: Exercise for at least 150 minutes each week. The exercise should increase your heart rate and make you sweat (moderate-intensity exercise). Do strengthening exercises at least twice a week. This is in addition to the moderate-intensity exercise. Spend less time sitting. Even light physical activity can be beneficial. Watch cholesterol and blood lipids Have your blood tested for lipids and cholesterol at 59 years of age, then havethis test every 5 years. You may need to have your cholesterol levels checked more often if: Your lipid or cholesterol levels are high. You are older than 59 years of age. You are at high risk for heart disease. What should I know about cancer screening? Many types of cancers can be detected early and may often be prevented. Depending on your health history and family history, you may need to have cancer screening at various ages. This may include screening for: Colorectal cancer. Prostate cancer. Skin cancer. Lung  cancer. What should I know about heart disease, diabetes, and high blood pressure? Blood pressure and heart disease High blood pressure causes heart disease and increases the risk of stroke. This is more likely to develop in people who have high blood pressure readings, are of African descent, or are overweight. Talk with your health care provider about your target blood pressure readings. Have your blood pressure checked: Every 3-5 years if you are 18-39 years of age. Every year if you are 40 years old or older. If you are between the ages of 65 and 75 and are a current or former smoker, ask your health care provider if you should have a one-time screening for abdominal aortic aneurysm (AAA). Diabetes Have regular diabetes screenings. This checks your fasting blood sugar level. Have the screening done: Once every three years after age 45 if you are at a normal weight and have a low risk for diabetes. More often and at a younger age if you are overweight or have a high risk for diabetes. What should I know about preventing infection? Hepatitis B If you have a higher risk for hepatitis B, you should be screened for this virus. Talk with your health care provider to find out if you are at risk forhepatitis B infection. Hepatitis C Blood testing is recommended for: Everyone born from 1945 through 1965. Anyone with known risk factors for hepatitis C. Sexually transmitted infections (STIs) You should be screened each year for STIs, including gonorrhea and chlamydia, if: You are sexually active and are younger than 59 years of age. You are older than 59 years of age   and your health care provider tells you that you are at risk for this type of infection. Your sexual activity has changed since you were last screened, and you are at increased risk for chlamydia or gonorrhea. Ask your health care provider if you are at risk. Ask your health care provider about whether you are at high risk for HIV.  Your health care provider may recommend a prescription medicine to help prevent HIV infection. If you choose to take medicine to prevent HIV, you should first get tested for HIV. You should then be tested every 3 months for as long as you are taking the medicine. Follow these instructions at home: Lifestyle Do not use any products that contain nicotine or tobacco, such as cigarettes, e-cigarettes, and chewing tobacco. If you need help quitting, ask your health care provider. Do not use street drugs. Do not share needles. Ask your health care provider for help if you need support or information about quitting drugs. Alcohol use Do not drink alcohol if your health care provider tells you not to drink. If you drink alcohol: Limit how much you have to 0-2 drinks a day. Be aware of how much alcohol is in your drink. In the [place] one drink equals one 12 oz bottle of beer (355 mL), one 5 oz glass of wine (148 mL), or one 1 oz glass of hard liquor (44 mL). General instructions Schedule regular health, dental, and eye exams. Stay current with your vaccines. Tell your health care provider if: You often feel depressed. You have ever been abused or do not feel safe at home. Summary Adopting a healthy lifestyle and getting preventive care are important in promoting health and wellness. Follow your health care provider's instructions about healthy diet, exercising, and getting tested or screened for diseases. Follow your health care provider's instructions on monitoring your cholesterol and blood pressure. This information is not intended to replace advice given to you by your health care provider. Make sure you discuss any questions you have with your healthcare provider. Document Revised: 03/15/2018 Document Reviewed: 03/15/2018 Elsevier Patient Education  2022 [employer] Inc. Mantenimiento de la salud en los hombres Health Maintenance, Male Adoptar un estilo de vida saludable y recibir atencin  preventiva son importantes para promover la salud y Counsellor. Consulte al mdico sobre: El esquema adecuado para hacerse pruebas y exmenes peridicos. Cosas que puede hacer por su cuenta para prevenir enfermedades y [place] sano. Qu debo saber sobre la dieta, el peso y el ejercicio? Consuma una dieta saludable  Consuma una dieta que incluya muchas verduras, frutas, productos lcteos con bajo contenido de [place] y Associate Professor. No consuma muchos alimentos ricos en grasas slidas, azcares agregados o sodio.  Mantenga un peso saludable El ndice de masa muscular [facility]) es una medida que puede utilizarse para identificar posibles problemas de [place]. Proporciona una estimacin de la grasa corporal basndose en el peso y la altura. Su mdico puede ayudarle Furniture conservator/restorer [facility] y a Personnel officer o Pharmacologist un peso saludable. Haga ejercicio con regularidad Haga ejercicio con regularidad. Esta es una de las prcticas ms importantes que puede hacer por su salud. La [employer] de los adultos deben seguir estas pautas: Education officer, environmental, al menos, 150 minutos de actividad fsica por semana. El ejercicio debe aumentar la frecuencia cardaca y Media planner transpirar (ejercicio de intensidad moderada). Hacer ejercicios de fortalecimiento por lo [employer] por semana. Agregue esto a su plan de ejercicio de intensidad moderada. Pasar menos tiempo sentados. Incluso la actividad fsica  ligera puede ser beneficiosa. Controle sus niveles de colesterol y lpidos en la sangre Comience a realizarse anlisis de lpidos y colesterol en la sangre a los20 aos y luego reptalos cada 5 aos. Es posible que Insurance underwriter los niveles de colesterol con mayor frecuencia si: Sus niveles de lpidos y colesterol son altos. Es mayor de 40 aos. Presenta un alto riesgo de padecer enfermedades cardacas. Qu debo saber sobre las pruebas de deteccin del cncer? Muchos tipos de cncer pueden detectarse de manera temprana y, a menudo,  pueden prevenirse. Segn su historia clnica y sus antecedentes familiares, es posible que deba realizarse pruebas de deteccin del cncer en diferentes edades. Esto puede incluir pruebas de deteccin de lo siguiente: Building services engineer. Cncer de prstata. Cncer de piel. Cncer de pulmn. Qu debo saber sobre la enfermedad cardaca, la diabetes y la hipertensinarterial? Presin arterial y enfermedad cardaca La hipertensin arterial causa enfermedades cardacas y [place] el riesgo de accidente cerebrovascular. Es ms probable que esto se manifieste en las personas que tienen lecturas de presin arterial alta, tienen ascendencia africana o tienen sobrepeso. Hable con el mdico sobre sus valores de presin arterial deseados. Hgase controlar la presin arterial: Cada 3 a 5 aos si tiene entre 18 y 70 aos. Todos los aos si es mayor de 40 aos. Si tiene entre 65 y 27 aos y es fumador o Insurance underwriter, pregntele al mdico si debe realizarse una prueba de deteccin de aneurisma artico abdominal (AAA) por nica vez. Diabetes Realcese exmenes de deteccin de la diabetes con regularidad. Este anlisis revisa el nivel de azcar en la sangre en [place]. Hgase las pruebas de deteccin: Cada tres aos despus de los 45 aos de edad si tiene un peso normal y un bajo riesgo de padecer diabetes. Con ms frecuencia y a partir de [place] edad inferior si tiene sobrepeso o un alto riesgo de padecer diabetes. Qu debo saber sobre la prevencin de infecciones? Hepatitis B Si tiene un riesgo ms alto de contraer hepatitis B, debe someterse a un examen de deteccin de este virus. Hable con el mdico para averiguar si tiene riesgode contraer la infeccin por hepatitis B. Hepatitis C Se recomienda un anlisis de [place] para: Todos los que nacieron entre 1945 y [phone]. Todas las personas que tengan un riesgo de haber contrado hepatitis C. Enfermedades de transmisin sexual (ETS) Debe realizarse pruebas de  deteccin de ITS todos los aos, incluidas la gonorrea y la clamidia, si: Es sexualmente activo y es menor de [address]. Es mayor de [address], y Public affairs consultant informa que corre riesgo de tener este tipo de infecciones. La actividad sexual ha cambiado desde que le hicieron la ltima prueba de deteccin y tiene un riesgo mayor de Warehouse manager clamidia o Copy. Pregntele al mdico si usted tiene riesgo. Pregntele al mdico si usted tiene un alto riesgo de Primary school teacher VIH. El mdico tambin puede recomendarle un medicamento recetado para ayudar a evitar la infeccin por el VIH. Si elige tomar medicamentos para prevenir el VIH, primero debe [employer] de deteccin del VIH. Luego debe hacerse anlisis cada 3 meses mientras est tomando los medicamentos. Siga estas instrucciones en su casa: Estilo de vida No consuma ningn producto que contenga nicotina o tabaco, como cigarrillos, cigarrillos electrnicos y tabaco de Theatre manager. Si necesita ayuda para dejar de fumar, consulte al mdico. No consuma drogas. No comparta agujas. Solicite ayuda a su mdico si necesita apoyo o informacin para abandonar las drogas. Consumo de alcohol No beba alcohol  si el mdico se lo prohbe. Si bebe alcohol: Limite la cantidad que consume de 0 a 2 medidas por da. Est atento a la cantidad de alcohol que hay en las bebidas que toma. En los [address], una medida equivale a una botella de cerveza de 12 oz (355 ml), un vaso de vino de 5 oz (148 ml) o un vaso de una bebida alcohlica de alta graduacin de 1 oz (44 ml). Instrucciones generales Realcese los estudios de rutina de la salud, dentales y de Wellsite geologist. Mantngase al da con las vacunas. Infrmele a su mdico si: Se siente deprimido con frecuencia. Alguna vez ha sido vctima de [place] o no se siente seguro en su casa. Resumen Adoptar un estilo de vida saludable y recibir atencin preventiva son importantes para promover la salud y Counsellor. Siga las  instrucciones del mdico acerca de una dieta saludable, el ejercicio y la realizacin de pruebas o exmenes para Hotel manager. Siga las instrucciones del mdico con respecto al control del colesterol y la presin arterial. Esta informacin no tiene Theme park manager el consejo del mdico. Asegresede hacerle al mdico cualquier pregunta que tenga. Document Revised: 04/12/2018 Document Reviewed: 04/12/2018 Elsevier Patient Education  2022 [employer].

## 2020-09-30 NOTE — Progress Notes (Addendum)
Robert Dudley 60 y.o.   Chief Complaint  Patient presents with   Annual Exam    HISTORY OF PRESENT ILLNESS: This is a 60 y.o. male here for his annual exam. Has history of prediabetes and GERD. Colonoscopy done about 2 to 3 years ago.  Normal.  Told to return in 10 years. No complaints or medical concerns today. Non-smoker.  Healthy lifestyle.  Physically active at work.  HPI   Prior to Admission medications   Medication Sig Start Date End Date Taking? Authorizing Provider  acetaminophen (TYLENOL) 325 MG tablet Take 650 mg by mouth every 6 (six) hours as needed.   Yes [provider]  fexofenadine (ALLEGRA) 180 MG tablet Take 180 mg by mouth daily.   Yes [provider]  OMEPRAZOLE PO Take by mouth as needed.   Yes [provider]  COLLAGEN PO Take by mouth. Patient not taking: Reported on 09/30/2020    [provider]    No Known Allergies  Patient Active Problem List   Diagnosis Date Noted   Prediabetes 08/21/2019   Gastroesophageal reflux disease without esophagitis 04/03/2018    History reviewed. No pertinent past medical history.  Past Surgical History:  Procedure Laterality Date   APPENDECTOMY  [id number]    Social History   Socioeconomic History   Marital status: Married    Spouse name: Not on file   Number of children: Not on file   Years of education: Not on file   Highest education level: Not on file  Occupational History   Not on file  Tobacco Use   Smoking status: Never   Smokeless tobacco: Never  Substance and Sexual Activity   Alcohol use: Yes    Comment: sometimes   Drug use: Never   Sexual activity: Not on file  Other Topics Concern   Not on file  Social History Narrative   Not on file   Social Determinants of Health   Financial Resource Strain: Not on file  Food Insecurity: Not on file  Transportation Needs: Not on file  Physical Activity: Not on file  Stress: Not on file  Social Connections: Not on  file  Intimate Partner Violence: Not on file    Family History  Problem Relation Age of Onset   Arthritis Mother    Heart disease Father    Arthritis Sister      Review of Systems  Constitutional: Negative.  Negative for chills and fever.  HENT: Negative.  Negative for congestion and sore throat.   Eyes: Negative.   Respiratory: Negative.  Negative for cough and shortness of breath.   Cardiovascular: Negative.  Negative for chest pain and palpitations.  Gastrointestinal: Negative.  Negative for abdominal pain, blood in stool, diarrhea, heartburn, melena, nausea and vomiting.  Genitourinary: Negative.  Negative for dysuria and hematuria.  Musculoskeletal: Negative.  Negative for myalgias and neck pain.  Skin: Negative.  Negative for rash.  Neurological: Negative.  Negative for dizziness and headaches.  All other systems reviewed and are negative.  Today's Vitals   09/30/20 0938  BP: 116/62  Pulse: 71  Temp: 98.2 F (36.8 C)  TempSrc: Oral  SpO2: 97%  Weight: 175 lb 9.6 oz (79.7 kg)  Height: 5\' 7"  (1.702 m)   Body mass index is 27.5 kg/m. Wt Readings from Last 3 Encounters:  09/30/20 175 lb 9.6 oz (79.7 kg)  08/21/19 174 lb (78.9 kg)  01/16/19 170 lb 12.8 oz (77.5 kg)    Physical Exam Vitals reviewed.  Constitutional:      Appearance: Normal appearance.  HENT:     Head: Normocephalic.     Right Ear: Tympanic membrane, ear canal and external ear normal.     Left Ear: Tympanic membrane, ear canal and external ear normal.     Mouth/Throat:     Mouth: Mucous membranes are moist.     Pharynx: Oropharynx is clear.  Eyes:     Extraocular Movements: Extraocular movements intact.     Conjunctiva/sclera: Conjunctivae normal.     Pupils: Pupils are equal, round, and reactive to light.  Neck:     Vascular: No carotid bruit.  Cardiovascular:     Rate and Rhythm: Normal rate and regular rhythm.     Pulses: Normal pulses.     Heart sounds: Normal heart sounds.   Pulmonary:     Effort: Pulmonary effort is normal.     Breath sounds: Normal breath sounds.  Abdominal:     General: Bowel sounds are normal. There is no distension.     Palpations: Abdomen is soft. There is no mass.     Tenderness: There is no abdominal tenderness.  Musculoskeletal:        General: Normal range of motion.     Cervical back: Normal range of motion and neck supple. No tenderness.     Right lower leg: No edema.     Left lower leg: No edema.  Lymphadenopathy:     Cervical: No cervical adenopathy.  Skin:    General: Skin is warm and dry.     Capillary Refill: Capillary refill takes less than 2 seconds.  Neurological:     General: No focal deficit present.     Mental Status: He is alert and oriented to person, place, and time.  Psychiatric:        Mood and Affect: Mood normal.        Behavior: Behavior normal.     ASSESSMENT & PLAN: Robert Dudley was seen today for annual exam.  Diagnoses and all orders for this visit:  Routine general medical examination at a health care facility  Screening for deficiency anemia -     CBC with Differential/Platelet  Screening for lipoid disorders -     Lipid panel  Screening for endocrine, metabolic and immunity disorder -     Comprehensive metabolic panel -     Hemoglobin A1c -     TSH  Screening for HIV (human immunodeficiency virus) -     HIV Antibody (routine testing w rflx)  Need for hepatitis C screening test -     Hepatitis C antibody  History of gastroesophageal reflux (GERD)  Prediabetes   Modifiable risk factors discussed with patient. Anticipatory guidance according to age provided. The following topics were also discussed: Smoking Diet and nutrition including discussion of prediabetes and GERD and foods to avoid symptoms Benefits of exercise Cancer screening and possible colonoscopy if due Vaccinations Cardiovascular risk assessment Mental health including depression and anxiety as well as mind-body  connection Fall and accident prevention  Patient Instructions  Health Maintenance, Male Adopting a healthy lifestyle and getting preventive care are important in promoting health and wellness. Ask your health care provider about: The right schedule for you to have regular tests and exams. Things you can do on your own to prevent diseases and keep yourself healthy. What should I know about diet, weight, and exercise? Eat a healthy diet  Eat a diet that includes plenty of vegetables, fruits, low-fat dairy products, and  lean protein. Do not eat a lot of foods that are high in solid fats, added sugars, or sodium.  Maintain a healthy weight Body mass index (BMI) is a measurement that can be used to identify possible weight problems. It estimates body fat based on height and weight. Your health care provider can help determine your BMI and help you achieve or maintain ahealthy weight. Get regular exercise Get regular exercise. This is one of the most important things you can do for your health. Most adults should: Exercise for at least 150 minutes each week. The exercise should increase your heart rate and make you sweat (moderate-intensity exercise). Do strengthening exercises at least twice a week. This is in addition to the moderate-intensity exercise. Spend less time sitting. Even light physical activity can be beneficial. Watch cholesterol and blood lipids Have your blood tested for lipids and cholesterol at 60 years of age, then havethis test every 5 years. You may need to have your cholesterol levels checked more often if: Your lipid or cholesterol levels are high. You are older than 59 years of age. You are at high risk for heart disease. What should I know about cancer screening? Many types of cancers can be detected early and may often be prevented. Depending on your health history and family history, you may need to have cancer screening at various ages. This may include screening  for: Colorectal cancer. Prostate cancer. Skin cancer. Lung cancer. What should I know about heart disease, diabetes, and high blood pressure? Blood pressure and heart disease High blood pressure causes heart disease and increases the risk of stroke. This is more likely to develop in people who have high blood pressure readings, are of African descent, or are overweight. Talk with your health care provider about your target blood pressure readings. Have your blood pressure checked: Every 3-5 years if you are 57-64 years of age. Every year if you are 55 years old or older. If you are between the ages of 48 and 75 and are a current or former smoker, ask your health care provider if you should have a one-time screening for abdominal aortic aneurysm (AAA). Diabetes Have regular diabetes screenings. This checks your fasting blood sugar level. Have the screening done: Once every three years after age 52 if you are at a normal weight and have a low risk for diabetes. More often and at a younger age if you are overweight or have a high risk for diabetes. What should I know about preventing infection? Hepatitis B If you have a higher risk for hepatitis B, you should be screened for this virus. Talk with your health care provider to find out if you are at risk forhepatitis B infection. Hepatitis C Blood testing is recommended for: Everyone born from [birth year] through 1964. Anyone with known risk factors for hepatitis C. Sexually transmitted infections (STIs) You should be screened each year for STIs, including gonorrhea and chlamydia, if: You are sexually active and are younger than 59 years of age. You are older than 59 years of age and your health care provider tells you that you are at risk for this type of infection. Your sexual activity has changed since you were last screened, and you are at increased risk for chlamydia or gonorrhea. Ask your health care provider if you are at risk. Ask your  health care provider about whether you are at high risk for HIV. Your health care provider may recommend a prescription medicine to help prevent HIV infection.  If you choose to take medicine to prevent HIV, you should first get tested for HIV. You should then be tested every 3 months for as long as you are taking the medicine. Follow these instructions at home: Lifestyle Do not use any products that contain nicotine or tobacco, such as cigarettes, e-cigarettes, and chewing tobacco. If you need help quitting, ask your health care provider. Do not use street drugs. Do not share needles. Ask your health care provider for help if you need support or information about quitting drugs. Alcohol use Do not drink alcohol if your health care provider tells you not to drink. If you drink alcohol: Limit how much you have to 0-2 drinks a day. Be aware of how much alcohol is in your drink. In the [place] one drink equals one 12 oz bottle of beer (355 mL), one 5 oz glass of wine (148 mL), or one 1 oz glass of hard liquor (44 mL). General instructions Schedule regular health, dental, and eye exams. Stay current with your vaccines. Tell your health care provider if: You often feel depressed. You have ever been abused or do not feel safe at home. Summary Adopting a healthy lifestyle and getting preventive care are important in promoting health and wellness. Follow your health care provider's instructions about healthy diet, exercising, and getting tested or screened for diseases. Follow your health care provider's instructions on monitoring your cholesterol and blood pressure. This information is not intended to replace advice given to you by your health care provider. Make sure you discuss any questions you have with your healthcare provider. Document Revised: 03/15/2018 Document Reviewed: 03/15/2018 Elsevier Patient Education  2022 Elsevier Inc. Mantenimiento de la salud en los hombres Health Maintenance,  Male Adoptar un estilo de vida saludable y recibir atencin preventiva son importantes para promover la salud y Counsellor. Consulte al mdico sobre: El esquema adecuado para hacerse pruebas y exmenes peridicos. Cosas que puede hacer por su cuenta para prevenir enfermedades y [place] sano. Qu debo saber sobre la dieta, el peso y el ejercicio? Consuma una dieta saludable  Consuma una dieta que incluya muchas verduras, frutas, productos lcteos con bajo contenido de [place] y Associate Professor. No consuma muchos alimentos ricos en grasas slidas, azcares agregados o sodio.  Mantenga un peso saludable El ndice de masa muscular [facility]) es una medida que puede utilizarse para identificar posibles problemas de [place]. Proporciona una estimacin de la grasa corporal basndose en el peso y la altura. Su mdico puede ayudarle Furniture conservator/restorer [facility] y a Personnel officer o Pharmacologist un peso saludable. Haga ejercicio con regularidad Haga ejercicio con regularidad. Esta es una de las prcticas ms importantes que puede hacer por su salud. La [employer] de los adultos deben seguir estas pautas: Education officer, environmental, al menos, 150 minutos de actividad fsica por semana. El ejercicio debe aumentar la frecuencia cardaca y Media planner transpirar (ejercicio de intensidad moderada). Hacer ejercicios de fortalecimiento por lo [employer] por semana. Agregue esto a su plan de ejercicio de intensidad moderada. Pasar menos tiempo sentados. Incluso la actividad fsica ligera puede ser beneficiosa. Controle sus niveles de colesterol y lpidos en la sangre Comience a realizarse anlisis de lpidos y colesterol en la sangre a los20 aos y luego reptalos cada 5 aos. Es posible que Insurance underwriter los niveles de colesterol con mayor frecuencia si: Sus niveles de lpidos y colesterol son altos. Es mayor de 40 aos. Presenta un alto riesgo de padecer enfermedades cardacas. Qu debo saber sobre las pruebas de deteccin  del cncer? Muchos  tipos de cncer pueden detectarse de manera temprana y, a menudo, pueden prevenirse. Segn su historia clnica y sus antecedentes familiares, es posible que deba realizarse pruebas de deteccin del cncer en diferentes edades. Esto puede incluir pruebas de deteccin de lo siguiente: Building services engineer. Cncer de prstata. Cncer de piel. Cncer de pulmn. Qu debo saber sobre la enfermedad cardaca, la diabetes y la hipertensinarterial? Presin arterial y enfermedad cardaca La hipertensin arterial causa enfermedades cardacas y [place] el riesgo de accidente cerebrovascular. Es ms probable que esto se manifieste en las personas que tienen lecturas de presin arterial alta, tienen ascendencia africana o tienen sobrepeso. Hable con el mdico sobre sus valores de presin arterial deseados. Hgase controlar la presin arterial: Cada 3 a 5 aos si tiene entre 18 y 41 aos. Todos los aos si es mayor de 40 aos. Si tiene entre 65 y 66 aos y es fumador o Insurance underwriter, pregntele al mdico si debe realizarse una prueba de deteccin de aneurisma artico abdominal (AAA) por nica vez. Diabetes Realcese exmenes de deteccin de la diabetes con regularidad. Este anlisis revisa el nivel de azcar en la sangre en [place]. Hgase las pruebas de deteccin: Cada tres aos despus de los 45 aos de edad si tiene un peso normal y un bajo riesgo de padecer diabetes. Con ms frecuencia y a partir de [place] edad inferior si tiene sobrepeso o un alto riesgo de padecer diabetes. Qu debo saber sobre la prevencin de infecciones? Hepatitis B Si tiene un riesgo ms alto de contraer hepatitis B, debe someterse a un examen de deteccin de este virus. Hable con el mdico para averiguar si tiene riesgode contraer la infeccin por hepatitis B. Hepatitis C Se recomienda un anlisis de [place] para: Todos los que nacieron entre 1945 y [phone]. Todas las personas que tengan un riesgo de haber contrado hepatitis  C. Enfermedades de transmisin sexual (ETS) Debe realizarse pruebas de deteccin de ITS todos los aos, incluidas la gonorrea y la clamidia, si: Es sexualmente activo y es menor de [address]. Es mayor de [address], y Public affairs consultant informa que corre riesgo de tener este tipo de infecciones. La actividad sexual ha cambiado desde que le hicieron la ltima prueba de deteccin y tiene un riesgo mayor de Warehouse manager clamidia o Copy. Pregntele al mdico si usted tiene riesgo. Pregntele al mdico si usted tiene un alto riesgo de Primary school teacher VIH. El mdico tambin puede recomendarle un medicamento recetado para ayudar a evitar la infeccin por el VIH. Si elige tomar medicamentos para prevenir el VIH, primero debe [employer] de deteccin del VIH. Luego debe hacerse anlisis cada 3 meses mientras est tomando los medicamentos. Siga estas instrucciones en su casa: Estilo de vida No consuma ningn producto que contenga nicotina o tabaco, como cigarrillos, cigarrillos electrnicos y tabaco de Theatre manager. Si necesita ayuda para dejar de fumar, consulte al mdico. No consuma drogas. No comparta agujas. Solicite ayuda a su mdico si necesita apoyo o informacin para abandonar las drogas. Consumo de alcohol No beba alcohol si el mdico se lo prohbe. Si bebe alcohol: Limite la cantidad que consume de 0 a 2 medidas por da. Est atento a la cantidad de alcohol que hay en las bebidas que toma. En los [address], una medida equivale a una botella de cerveza de 12 oz (355 ml), un vaso de vino de 5 oz (148 ml) o un vaso de una bebida alcohlica de alta graduacin de 1 oz (44 ml).  Instrucciones generales Realcese los estudios de rutina de la salud, dentales y de Wellsite geologist. Mantngase al da con las vacunas. Infrmele a su mdico si: Se siente deprimido con frecuencia. Alguna vez ha sido vctima de [place] o no se siente seguro en su casa. Resumen Adoptar un estilo de vida saludable y recibir atencin preventiva  son importantes para promover la salud y Counsellor. Siga las instrucciones del mdico acerca de una dieta saludable, el ejercicio y la realizacin de pruebas o exmenes para Hotel manager. Siga las instrucciones del mdico con respecto al control del colesterol y la presin arterial. Esta informacin no tiene Theme park manager el consejo del mdico. Asegresede hacerle al mdico cualquier pregunta que tenga. Document Revised: 04/12/2018 Document Reviewed: 04/12/2018 Elsevier Patient Education  2022 Elsevier Inc.   Edwina Barth, MD [facility]

## 2020-10-01 LAB — HEPATITIS C ANTIBODY
Hepatitis C Ab: NONREACTIVE
SIGNAL TO CUT-OFF: 0.01 (ref <1.00)

## 2020-10-01 LAB — HIV ANTIBODY (ROUTINE TESTING W REFLEX): HIV 1&2 Ab, 4th Generation: NONREACTIVE

## 2020-10-10 ENCOUNTER — Encounter: Payer: Self-pay | Admitting: Emergency Medicine

## 2021-02-04 IMAGING — DX DG FINGER MIDDLE 2+V*L*
3 series · 3 of 3 positions shown · non-contrast
Comparison: None.

CLINICAL DATA: Crush type injury from door

EXAM:
LEFT THIRD FINGER 2+V

[finger pa]
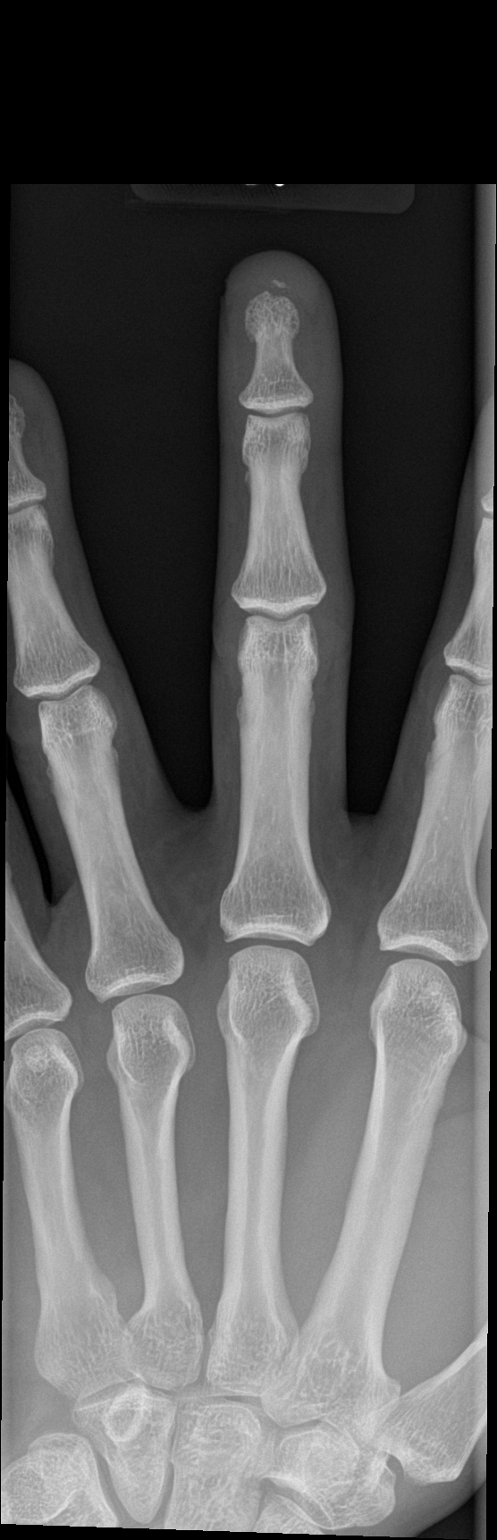

[finger obl]
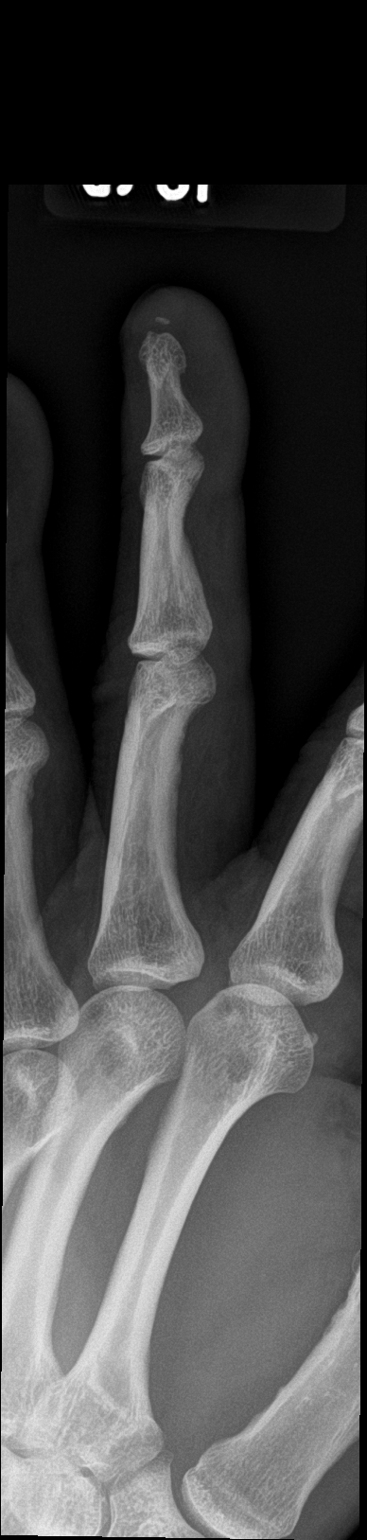

[finger lat]
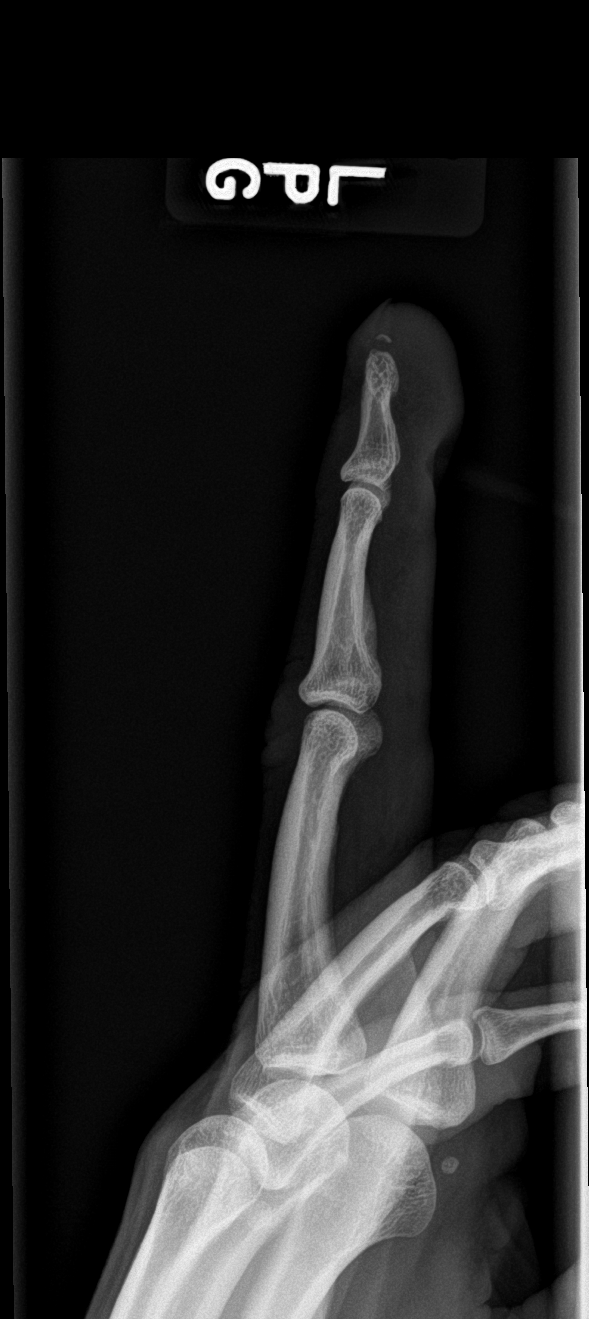

[3 of 3 positions shown; findings below may reference images not displayed]

FINDINGS: Frontal, oblique, and lateral views were obtained. There is an
avulsion along the distal aspect of the first distal phalanx. No
other fracture. No dislocation. Joint spaces appear normal. No
erosive change.
IMPRESSION: Avulsion along the distal aspect of the third distal phalanx. No
other fracture. No dislocation. No appreciable arthropathic change.

These results will be called to the ordering clinician or
representative by the Radiologist Assistant, and communication
documented in the PACS or [REDACTED].

## 2021-10-27 ENCOUNTER — Encounter: Payer: Self-pay | Admitting: Emergency Medicine

## 2021-10-27 ENCOUNTER — Ambulatory Visit (INDEPENDENT_AMBULATORY_CARE_PROVIDER_SITE_OTHER): Payer: BC Managed Care – PPO | Admitting: Emergency Medicine

## 2021-10-27 VITALS — BP 110/70 | HR 80 | Temp 98.2°F | Ht 67.0 in | Wt 171.1 lb

## 2021-10-27 DIAGNOSIS — Z130 Encounter for screening for diseases of the blood and blood-forming organs and certain disorders involving the immune mechanism: Secondary | ICD-10-CM | POA: Diagnosis not present

## 2021-10-27 DIAGNOSIS — Z1329 Encounter for screening for other suspected endocrine disorder: Secondary | ICD-10-CM | POA: Diagnosis not present

## 2021-10-27 DIAGNOSIS — Z0000 Encounter for general adult medical examination without abnormal findings: Secondary | ICD-10-CM

## 2021-10-27 DIAGNOSIS — Z23 Encounter for immunization: Secondary | ICD-10-CM | POA: Diagnosis not present

## 2021-10-27 DIAGNOSIS — Z13228 Encounter for screening for other metabolic disorders: Secondary | ICD-10-CM

## 2021-10-27 DIAGNOSIS — Z13220 Encounter for screening for lipoid disorders: Secondary | ICD-10-CM

## 2021-10-27 DIAGNOSIS — Z1211 Encounter for screening for malignant neoplasm of colon: Secondary | ICD-10-CM | POA: Diagnosis not present

## 2021-10-27 LAB — CBC WITH DIFFERENTIAL/PLATELET
Basophils Absolute: 0.1 10*3/uL (ref 0.0–0.1)
Basophils Relative: 1.1 % (ref 0.0–3.0)
Eosinophils Absolute: 0.1 10*3/uL (ref 0.0–0.7)
Eosinophils Relative: 0.9 % (ref 0.0–5.0)
HCT: 45.4 % (ref 39.0–52.0)
Hemoglobin: 15.5 g/dL (ref 13.0–17.0)
Lymphocytes Relative: 25 % (ref 12.0–46.0)
Lymphs Abs: 1.5 10*3/uL (ref 0.7–4.0)
MCHC: 34.1 g/dL (ref 30.0–36.0)
MCV: 94.2 fl (ref 78.0–100.0)
Monocytes Absolute: 0.4 10*3/uL (ref 0.1–1.0)
Monocytes Relative: 5.7 % (ref 3.0–12.0)
Neutro Abs: 4.1 10*3/uL (ref 1.4–7.7)
Neutrophils Relative %: 67.3 % (ref 43.0–77.0)
Platelets: 184 10*3/uL (ref 150.0–400.0)
RBC: 4.81 Mil/uL (ref 4.22–5.81)
RDW: 12.6 % (ref 11.5–15.5)
WBC: 6.1 10*3/uL (ref 4.0–10.5)

## 2021-10-27 LAB — COMPREHENSIVE METABOLIC PANEL
ALT: 17 U/L (ref 0–53)
AST: 20 U/L (ref 0–37)
Albumin: 4.5 g/dL (ref 3.5–5.2)
Alkaline Phosphatase: 73 U/L (ref 39–117)
BUN: 19 mg/dL (ref 6–23)
CO2: 29 mEq/L (ref 19–32)
Calcium: 9.5 mg/dL (ref 8.4–10.5)
Chloride: 101 mEq/L (ref 96–112)
Creatinine, Ser: 1.13 mg/dL (ref 0.40–1.50)
GFR: 70.95 mL/min (ref >60.00)
Glucose, Bld: 115 mg/dL — ABNORMAL HIGH (ref 70–99)
Potassium: 4.6 mEq/L (ref 3.5–5.1)
Sodium: 137 mEq/L (ref 135–145)
Total Bilirubin: 0.4 mg/dL (ref 0.2–1.2)
Total Protein: 7.8 g/dL (ref 6.0–8.3)

## 2021-10-27 LAB — LIPID PANEL
Cholesterol: 156 mg/dL (ref 0–200)
HDL: 54.5 mg/dL (ref >39.00)
LDL Cholesterol: 80 mg/dL (ref 0–99)
NonHDL: 101.23
Total CHOL/HDL Ratio: 3
Triglycerides: 107 mg/dL (ref 0.0–149.0)
VLDL: 21.4 mg/dL (ref 0.0–40.0)

## 2021-10-27 LAB — HEMOGLOBIN A1C: Hgb A1c MFr Bld: 6.3 % (ref 4.6–6.5)

## 2021-10-27 NOTE — Progress Notes (Signed)
Robert Dudley 61 y.o.   Chief Complaint  Patient presents with   Annual Exam    No concerns     HISTORY OF PRESENT ILLNESS: This is a 61 y.o. male here for annual exam. Has no complaints or medical concerns today. Has history of GERD, stable and under control.  On no medications.  HPI   Prior to Admission medications   Medication Sig Start Date End Date Taking? Authorizing Provider  acetaminophen (TYLENOL) 325 MG tablet Take 650 mg by mouth every 6 (six) hours as needed.   Yes [provider]  fexofenadine (ALLEGRA) 180 MG tablet Take 180 mg by mouth daily.   Yes [provider]  OMEPRAZOLE PO Take by mouth as needed.   Yes [provider]  COLLAGEN PO Take by mouth. Patient not taking: Reported on 09/30/2020    [provider]    No Known Allergies  Patient Active Problem List   Diagnosis Date Noted   Prediabetes 08/21/2019   Gastroesophageal reflux disease without esophagitis 04/03/2018    No past medical history on file.  Past Surgical History:  Procedure Laterality Date   APPENDECTOMY  67    Social History   Socioeconomic History   Marital status: Married    Spouse name: Not on file   Number of children: Not on file   Years of education: Not on file   Highest education level: Not on file  Occupational History   Not on file  Tobacco Use   Smoking status: Never   Smokeless tobacco: Never  Substance and Sexual Activity   Alcohol use: Yes    Comment: sometimes   Drug use: Never   Sexual activity: Not on file  Other Topics Concern   Not on file  Social History Narrative   Not on file   Social Determinants of Health   Financial Resource Strain: Not on file  Food Insecurity: Not on file  Transportation Needs: Not on file  Physical Activity: Not on file  Stress: Not on file  Social Connections: Not on file  Intimate Partner Violence: Not on file    Family History  Problem Relation Age of Onset   Arthritis  Mother    Heart disease Father    Arthritis Sister      Review of Systems  Constitutional: Negative.  Negative for chills and fever.  HENT: Negative.  Negative for congestion and sore throat.   Eyes: Negative.   Cardiovascular: Negative.  Negative for chest pain and palpitations.  Gastrointestinal:  Negative for abdominal pain, nausea and vomiting.  Genitourinary: Negative.  Negative for dysuria and hematuria.  Musculoskeletal:  Positive for back pain and joint pain.  Skin: Negative.  Negative for rash.  Neurological:  Negative for dizziness and headaches.  All other systems reviewed and are negative.  Today's Vitals   10/27/21 0926  BP: 110/70  Pulse: 80  Temp: 98.2 F (36.8 C)  TempSrc: Oral  SpO2: 98%  Weight: 171 lb 2 oz (77.6 kg)  Height: 5\' 7"  (1.702 m)   Body mass index is 26.8 kg/m.   Physical Exam Vitals reviewed.  Constitutional:      Appearance: Normal appearance.  HENT:     Head: Normocephalic.     Right Ear: Tympanic membrane, ear canal and external ear normal.     Left Ear: Tympanic membrane, ear canal and external ear normal.     Mouth/Throat:     Mouth: Mucous membranes are moist.  Pharynx: Oropharynx is clear.  Eyes:     Extraocular Movements: Extraocular movements intact.     Conjunctiva/sclera: Conjunctivae normal.     Pupils: Pupils are equal, round, and reactive to light.  Cardiovascular:     Rate and Rhythm: Normal rate and regular rhythm.     Pulses: Normal pulses.     Heart sounds: Normal heart sounds.  Pulmonary:     Effort: Pulmonary effort is normal.     Breath sounds: Normal breath sounds.  Abdominal:     General: Bowel sounds are normal. There is no distension.     Palpations: Abdomen is soft.     Tenderness: There is no abdominal tenderness.  Musculoskeletal:     Cervical back: No tenderness.     Right lower leg: No edema.     Left lower leg: No edema.  Lymphadenopathy:     Cervical: No cervical adenopathy.  Skin:     General: Skin is warm and dry.     Capillary Refill: Capillary refill takes less than 2 seconds.  Neurological:     General: No focal deficit present.     Mental Status: He is alert and oriented to person, place, and time.  Psychiatric:        Mood and Affect: Mood normal.        Behavior: Behavior normal.    ASSESSMENT & PLAN: Problem List Items Addressed This Visit   None Visit Diagnoses     Routine general medical examination at a health care facility    -  Primary   Need for vaccination       Relevant Orders   Zoster Recombinant (Shingrix ) (Completed)   Colon cancer screening       Relevant Orders   Cologuard   Screening for deficiency anemia       Relevant Orders   CBC with Differential   Screening for lipoid disorders       Relevant Orders   Lipid panel   Screening for endocrine, metabolic and immunity disorder       Relevant Orders   Comprehensive metabolic panel   Hemoglobin A1c   Need for shingles vaccine          Modifiable risk factors discussed with patient. Anticipatory guidance according to age provided. The following topics were also discussed: Social Determinants of Health Smoking.  Non-smoker Diet and nutrition.  Good eating habits Benefits of exercise.  Exercises regularly at work.  Walks a lot. Cancer screening and need for colon cancer screening Vaccinations recommendations Cardiovascular risk assessment The 10-year ASCVD risk score (Arnett DK, et al., 2019) is: 5.3%   Values used to calculate the score:     Age: 53 years     Sex: Male     Is Non-Hispanic African American: No     Diabetic: No     Tobacco smoker: No     Systolic Blood Pressure: 110 mmHg     Is BP treated: No     HDL Cholesterol: 52.4 mg/dL     Total Cholesterol: 179 mg/dL  Mental health including depression and anxiety Fall and accident prevention  Patient Instructions  Mantenimiento de Radiographer, therapeutic en los hombres Health Maintenance, Male Adoptar un estilo de vida  saludable y recibir atencin preventiva son importantes para promover la salud y Counsellor. Consulte al mdico sobre: El esquema adecuado para hacerse pruebas y exmenes peridicos. Cosas que puede hacer por su cuenta para prevenir enfermedades y [place] sano. Qu debo  saber sobre la dieta, el peso y el ejercicio? Consuma una dieta saludable  Consuma una dieta que incluya muchas verduras, frutas, productos lcteos con bajo contenido de [place] y Associate Professor. No consuma muchos alimentos ricos en grasas slidas, azcares agregados o sodio. Mantenga un peso saludable El ndice de masa muscular [facility]) es una medida que puede utilizarse para identificar posibles problemas de [place]. Proporciona una estimacin de la grasa corporal basndose en el peso y la altura. Su mdico puede ayudarle a Engineer, site IMC y a Personnel officer o Pharmacologist un peso saludable. Haga ejercicio con regularidad Haga ejercicio con regularidad. Esta es una de las prcticas ms importantes que puede hacer por su salud. La [employer] de los adultos deben seguir estas pautas: Education officer, environmental, al menos, 150 minutos de actividad fsica por semana. El ejercicio debe aumentar la frecuencia cardaca y Media planner transpirar (ejercicio de intensidad moderada). Hacer ejercicios de fortalecimiento por lo [employer] por semana. Agregue esto a su plan de ejercicio de intensidad moderada. Pase menos tiempo sentado. Incluso la actividad fsica ligera puede ser beneficiosa. Controle sus niveles de colesterol y lpidos en la sangre Comience a realizarse anlisis de lpidos y Oncologist en la sangre a los 20 aos y luego reptalos cada 5 aos. Es posible que Insurance underwriter los niveles de colesterol con mayor frecuencia si: Sus niveles de lpidos y colesterol son altos. Es mayor de 40 aos. Presenta un alto riesgo de padecer enfermedades cardacas. Qu debo saber sobre las pruebas de deteccin del cncer? Muchos tipos de cncer pueden detectarse de  manera temprana y, a menudo, pueden prevenirse. Segn su historia clnica y sus antecedentes familiares, es posible que deba realizarse pruebas de deteccin del cncer en diferentes edades. Esto puede incluir pruebas de deteccin de lo siguiente: Building services engineer. Cncer de prstata. Cncer de piel. Cncer de pulmn. Qu debo saber sobre la enfermedad cardaca, la diabetes y la hipertensin arterial? Presin arterial y enfermedad cardaca La hipertensin arterial causa enfermedades cardacas y [place] el riesgo de accidente cerebrovascular. Es ms probable que esto se manifieste en las personas que tienen lecturas de presin arterial alta o tienen sobrepeso. Hable con el mdico sobre sus valores de presin arterial deseados. Hgase controlar la presin arterial: Cada 3 a 5 aos si tiene entre 18 y 71 aos. Todos los aos si es mayor de 40 aos. Si tiene entre 65 y 59 aos y es fumador o Insurance underwriter, pregntele al mdico si debe realizarse una prueba de deteccin de aneurisma artico abdominal (AAA) por nica vez. Diabetes Realcese exmenes de deteccin de la diabetes con regularidad. Este anlisis revisa el nivel de azcar en la sangre en [place]. Hgase las pruebas de deteccin: Cada tres aos despus de los 45 aos de edad si tiene un peso normal y un bajo riesgo de padecer diabetes. Con ms frecuencia y a partir de [place] edad inferior si tiene sobrepeso o un alto riesgo de padecer diabetes. Qu debo saber sobre la prevencin de infecciones? Hepatitis B Si tiene un riesgo ms alto de contraer hepatitis B, debe someterse a un examen de deteccin de este virus. Hable con el mdico para averiguar si tiene riesgo de contraer la infeccin por hepatitis B. Hepatitis C Se recomienda un anlisis de [place] para: Todos los que nacieron entre 1945 y [phone]. Todas las personas que tengan un riesgo de haber contrado hepatitis C. Enfermedades de transmisin sexual (ETS) Debe realizarse pruebas de  deteccin de ITS todos los aos, incluidas la gonorrea y la clamidia,  si: Es sexualmente [place] y es menor de [address]. Es mayor de [address], y Public affairs consultant informa que corre riesgo de tener este tipo de infecciones. La actividad sexual ha cambiado desde que le hicieron la ltima prueba de deteccin y tiene un riesgo mayor de Warehouse manager clamidia o Copy. Pregntele al mdico si usted tiene riesgo. Pregntele al mdico si usted tiene un alto riesgo de Primary school teacher VIH. El mdico tambin puede recomendarle un medicamento recetado para ayudar a evitar la infeccin por el VIH. Si elige tomar medicamentos para prevenir el VIH, primero debe [employer] de deteccin del VIH. Luego debe hacerse anlisis cada 3 meses mientras est tomando los medicamentos. Siga estas indicaciones en su casa: Consumo de alcohol No beba alcohol si el mdico se lo prohbe. Si bebe alcohol: Limite la cantidad que consume de 0 a 2 bebidas por da. Sepa cunta cantidad de alcohol hay en las bebidas que toma. En los [address], una medida equivale a una botella de cerveza de 12 oz (355 ml), un vaso de vino de 5 oz (148 ml) o un vaso de una bebida alcohlica de alta graduacin de 1 oz (44 ml). Estilo de vida No consuma ningn producto que contenga nicotina o tabaco. Estos productos incluyen cigarrillos, tabaco para Theatre manager y aparatos de vapeo, como los Administrator, Civil Service. Si necesita ayuda para dejar de consumir estos productos, consulte al mdico. No consuma drogas. No comparta agujas. Solicite ayuda a su mdico si necesita apoyo o informacin para abandonar las drogas. Indicaciones generales Realcese los estudios de rutina de [address], dentales y de Wellsite geologist. Mantngase al da con las vacunas. Infrmele a su mdico si: Se siente deprimido con frecuencia. Alguna vez ha sido vctima de [place] o no se siente seguro en su casa. Resumen Adoptar un estilo de vida saludable y recibir atencin preventiva son  importantes para promover la salud y Counsellor. Siga las instrucciones del mdico acerca de una dieta saludable, el ejercicio y la realizacin de pruebas o exmenes para Hotel manager. Siga las instrucciones del mdico con respecto al control del colesterol y la presin arterial. Esta informacin no tiene Theme park manager el consejo del mdico. Asegrese de hacerle al mdico cualquier pregunta que tenga. Document Revised: 08/27/2020 Document Reviewed: 08/27/2020 Elsevier Patient Education  2023 Elsevier Inc.     Edwina Barth, MD [facility]

## 2021-10-27 NOTE — Patient Instructions (Signed)
Mantenimiento de la salud en los hombres Health Maintenance, Male Adoptar un estilo de vida saludable y recibir atencin preventiva son importantes para promover la salud y el bienestar. Consulte al mdico sobre: El esquema adecuado para hacerse pruebas y exmenes peridicos. Cosas que puede hacer por su cuenta para prevenir enfermedades y mantenerse sano. Qu debo saber sobre la dieta, el peso y el ejercicio? Consuma una dieta saludable  Consuma una dieta que incluya muchas verduras, frutas, productos lcteos con bajo contenido de grasa y protenas magras. No consuma muchos alimentos ricos en grasas slidas, azcares agregados o sodio. Mantenga un peso saludable El ndice de masa muscular (IMC) es una medida que puede utilizarse para identificar posibles problemas de peso. Proporciona una estimacin de la grasa corporal basndose en el peso y la altura. Su mdico puede ayudarle a determinar su IMC y a lograr o mantener un peso saludable. Haga ejercicio con regularidad Haga ejercicio con regularidad. Esta es una de las prcticas ms importantes que puede hacer por su salud. La mayora de los adultos deben seguir estas pautas: Realizar, al menos, 150 minutos de actividad fsica por semana. El ejercicio debe aumentar la frecuencia cardaca y hacerlo transpirar (ejercicio de intensidad moderada). Hacer ejercicios de fortalecimiento por lo menos dos veces por semana. Agregue esto a su plan de ejercicio de intensidad moderada. Pase menos tiempo sentado. Incluso la actividad fsica ligera puede ser beneficiosa. Controle sus niveles de colesterol y lpidos en la sangre Comience a realizarse anlisis de lpidos y colesterol en la sangre a los 20 aos y luego reptalos cada 5 aos. Es posible que necesite controlar los niveles de colesterol con mayor frecuencia si: Sus niveles de lpidos y colesterol son altos. Es mayor de 40 aos. Presenta un alto riesgo de padecer enfermedades cardacas. Qu debo  saber sobre las pruebas de deteccin del cncer? Muchos tipos de cncer pueden detectarse de manera temprana y, a menudo, pueden prevenirse. Segn su historia clnica y sus antecedentes familiares, es posible que deba realizarse pruebas de deteccin del cncer en diferentes edades. Esto puede incluir pruebas de deteccin de lo siguiente: Cncer colorrectal. Cncer de prstata. Cncer de piel. Cncer de pulmn. Qu debo saber sobre la enfermedad cardaca, la diabetes y la hipertensin arterial? Presin arterial y enfermedad cardaca La hipertensin arterial causa enfermedades cardacas y aumenta el riesgo de accidente cerebrovascular. Es ms probable que esto se manifieste en las personas que tienen lecturas de presin arterial alta o tienen sobrepeso. Hable con el mdico sobre sus valores de presin arterial deseados. Hgase controlar la presin arterial: Cada 3 a 5 aos si tiene entre 18 y 39 aos. Todos los aos si es mayor de 40 aos. Si tiene entre 65 y 75 aos y es fumador o sola fumar, pregntele al mdico si debe realizarse una prueba de deteccin de aneurisma artico abdominal (AAA) por nica vez. Diabetes Realcese exmenes de deteccin de la diabetes con regularidad. Este anlisis revisa el nivel de azcar en la sangre en ayunas. Hgase las pruebas de deteccin: Cada tres aos despus de los 45 aos de edad si tiene un peso normal y un bajo riesgo de padecer diabetes. Con ms frecuencia y a partir de una edad inferior si tiene sobrepeso o un alto riesgo de padecer diabetes. Qu debo saber sobre la prevencin de infecciones? Hepatitis B Si tiene un riesgo ms alto de contraer hepatitis B, debe someterse a un examen de deteccin de este virus. Hable con el mdico para averiguar si tiene riesgo de   contraer la infeccin por hepatitis B. Hepatitis C Se recomienda un anlisis de sangre para: Todos los que nacieron entre 1945 y 1965. Todas las personas que tengan un riesgo de haber  contrado hepatitis C. Enfermedades de transmisin sexual (ETS) Debe realizarse pruebas de deteccin de ITS todos los aos, incluidas la gonorrea y la clamidia, si: Es sexualmente activo y es menor de 24 aos. Es mayor de 24 aos, y el mdico le informa que corre riesgo de tener este tipo de infecciones. La actividad sexual ha cambiado desde que le hicieron la ltima prueba de deteccin y tiene un riesgo mayor de tener clamidia o gonorrea. Pregntele al mdico si usted tiene riesgo. Pregntele al mdico si usted tiene un alto riesgo de contraer VIH. El mdico tambin puede recomendarle un medicamento recetado para ayudar a evitar la infeccin por el VIH. Si elige tomar medicamentos para prevenir el VIH, primero debe hacerse los anlisis de deteccin del VIH. Luego debe hacerse anlisis cada 3 meses mientras est tomando los medicamentos. Siga estas indicaciones en su casa: Consumo de alcohol No beba alcohol si el mdico se lo prohbe. Si bebe alcohol: Limite la cantidad que consume de 0 a 2 bebidas por da. Sepa cunta cantidad de alcohol hay en las bebidas que toma. En los [place], una medida equivale a una botella de cerveza de 12 oz (355 ml), un vaso de vino de 5 oz (148 ml) o un vaso de una bebida alcohlica de alta graduacin de 1 oz (44 ml). Estilo de vida No consuma ningn producto que contenga nicotina o tabaco. Estos productos incluyen cigarrillos, tabaco para mascar y aparatos de vapeo, como los cigarrillos electrnicos. Si necesita ayuda para dejar de consumir estos productos, consulte al mdico. No consuma drogas. No comparta agujas. Solicite ayuda a su mdico si necesita apoyo o informacin para abandonar las drogas. Indicaciones generales Realcese los estudios de rutina de la salud, dentales y de la vista. Mantngase al da con las vacunas. Infrmele a su mdico si: Se siente deprimido con frecuencia. Alguna vez ha sido vctima de [place]tr[place] o no se siente seguro en su  casa. Resumen Adoptar un estilo de vida saludable y recibir atencin preventiva son importantes para promover la salud y el bienestar. Siga las instrucciones del mdico acerca de una dieta saludable, el ejercicio y la realizacin de pruebas o exmenes para detectar enfermedades. Siga las instrucciones del mdico con respecto al control del colesterol y la presin arterial. Esta informacin no tiene como fin reemplazar el consejo del mdico. Asegrese de hacerle al mdico cualquier pregunta que tenga. Document Revised: 08/27/2020 Document Reviewed: 08/27/2020 Elsevier Patient Education  2023 Elsevier Inc.  

## 2022-01-29 ENCOUNTER — Ambulatory Visit (INDEPENDENT_AMBULATORY_CARE_PROVIDER_SITE_OTHER): Payer: BC Managed Care – PPO

## 2022-01-29 DIAGNOSIS — Z23 Encounter for immunization: Secondary | ICD-10-CM | POA: Diagnosis not present

## 2022-11-01 ENCOUNTER — Encounter: Payer: Self-pay | Admitting: Emergency Medicine

## 2022-11-01 ENCOUNTER — Ambulatory Visit (INDEPENDENT_AMBULATORY_CARE_PROVIDER_SITE_OTHER): Payer: BC Managed Care – PPO | Admitting: Emergency Medicine

## 2022-11-01 VITALS — BP 112/72 | HR 77 | Temp 98.0°F | Ht 67.0 in | Wt 170.4 lb

## 2022-11-01 DIAGNOSIS — Z130 Encounter for screening for diseases of the blood and blood-forming organs and certain disorders involving the immune mechanism: Secondary | ICD-10-CM

## 2022-11-01 DIAGNOSIS — Z13228 Encounter for screening for other metabolic disorders: Secondary | ICD-10-CM | POA: Diagnosis not present

## 2022-11-01 DIAGNOSIS — Z125 Encounter for screening for malignant neoplasm of prostate: Secondary | ICD-10-CM | POA: Diagnosis not present

## 2022-11-01 DIAGNOSIS — Z0000 Encounter for general adult medical examination without abnormal findings: Secondary | ICD-10-CM | POA: Diagnosis not present

## 2022-11-01 DIAGNOSIS — Z13220 Encounter for screening for lipoid disorders: Secondary | ICD-10-CM

## 2022-11-01 DIAGNOSIS — Z1329 Encounter for screening for other suspected endocrine disorder: Secondary | ICD-10-CM | POA: Diagnosis not present

## 2022-11-01 LAB — CBC WITH DIFFERENTIAL/PLATELET
Basophils Absolute: 0.1 10*3/uL (ref 0.0–0.1)
Basophils Relative: 1.1 % (ref 0.0–3.0)
Eosinophils Absolute: 0 10*3/uL (ref 0.0–0.7)
Eosinophils Relative: 0.7 % (ref 0.0–5.0)
HCT: 43 % (ref 39.0–52.0)
Hemoglobin: 14.6 g/dL (ref 13.0–17.0)
Lymphocytes Relative: 22.6 % (ref 12.0–46.0)
Lymphs Abs: 1.5 10*3/uL (ref 0.7–4.0)
MCHC: 33.9 g/dL (ref 30.0–36.0)
MCV: 94 fl (ref 78.0–100.0)
Monocytes Absolute: 0.4 10*3/uL (ref 0.1–1.0)
Monocytes Relative: 6 % (ref 3.0–12.0)
Neutro Abs: 4.5 10*3/uL (ref 1.4–7.7)
Neutrophils Relative %: 69.6 % (ref 43.0–77.0)
Platelets: 196 10*3/uL (ref 150.0–400.0)
RBC: 4.57 Mil/uL (ref 4.22–5.81)
RDW: 13.2 % (ref 11.5–15.5)
WBC: 6.5 10*3/uL (ref 4.0–10.5)

## 2022-11-01 LAB — LIPID PANEL
Cholesterol: 176 mg/dL (ref 0–200)
HDL: 60.5 mg/dL (ref >39.00)
LDL Cholesterol: 92 mg/dL (ref 0–99)
NonHDL: 115.45
Total CHOL/HDL Ratio: 3
Triglycerides: 118 mg/dL (ref 0.0–149.0)
VLDL: 23.6 mg/dL (ref 0.0–40.0)

## 2022-11-01 LAB — COMPREHENSIVE METABOLIC PANEL
ALT: 18 U/L (ref 0–53)
AST: 17 U/L (ref 0–37)
Albumin: 4.5 g/dL (ref 3.5–5.2)
Alkaline Phosphatase: 69 U/L (ref 39–117)
BUN: 16 mg/dL (ref 6–23)
CO2: 27 mEq/L (ref 19–32)
Calcium: 9.3 mg/dL (ref 8.4–10.5)
Chloride: 102 mEq/L (ref 96–112)
Creatinine, Ser: 1.04 mg/dL (ref 0.40–1.50)
GFR: 77.82 mL/min (ref >60.00)
Glucose, Bld: 122 mg/dL — ABNORMAL HIGH (ref 70–99)
Potassium: 3.9 mEq/L (ref 3.5–5.1)
Sodium: 138 mEq/L (ref 135–145)
Total Bilirubin: 0.6 mg/dL (ref 0.2–1.2)
Total Protein: 7.3 g/dL (ref 6.0–8.3)

## 2022-11-01 LAB — PSA: PSA: 9.9 ng/mL — ABNORMAL HIGH (ref 0.10–4.00)

## 2022-11-01 LAB — HEMOGLOBIN A1C: Hgb A1c MFr Bld: 6.2 % (ref 4.6–6.5)

## 2022-11-01 NOTE — Patient Instructions (Signed)
Mantenimiento de Research officer, political party, Male Adoptar un estilo de vida saludable y recibir atencin preventiva son importantes para promover la salud y Counsellor. Consulte al mdico sobre: El esquema adecuado para hacerse pruebas y exmenes peridicos. Cosas que puede hacer por su cuenta para prevenir enfermedades y [place] sano. Qu debo saber sobre la dieta, el peso y el ejercicio? Consuma una dieta saludable  Consuma una dieta que incluya muchas verduras, frutas, productos lcteos con bajo contenido de [place] y Associate Professor. No consuma muchos alimentos ricos en grasas slidas, azcares agregados o sodio. Mantenga un peso saludable El ndice de masa muscular [facility]) es una medida que puede utilizarse para identificar posibles problemas de [place]. Proporciona una estimacin de la grasa corporal basndose en el peso y la altura. Su mdico puede ayudarle a Engineer, site IMC y a Personnel officer o Pharmacologist un peso saludable. Haga ejercicio con regularidad Haga ejercicio con regularidad. Esta es una de las prcticas ms importantes que puede hacer por su salud. La [employer] de los adultos deben seguir estas pautas: Education officer, environmental, al menos, 150 minutos de actividad fsica por semana. El ejercicio debe aumentar la frecuencia cardaca y Media planner transpirar (ejercicio de intensidad moderada). Hacer ejercicios de fortalecimiento por lo [facility] por semana. Agregue esto a su plan de ejercicio de intensidad moderada. Pase menos tiempo sentado. Incluso la actividad fsica ligera puede ser beneficiosa. Controle sus niveles de colesterol y lpidos en la sangre Comience a realizarse anlisis de lpidos y Oncologist en la sangre a los 20 aos y luego reptalos cada 5 aos. Es posible que Insurance underwriter los niveles de colesterol con mayor frecuencia si: Sus niveles de lpidos y colesterol son altos. Es mayor de 40 aos. Presenta un alto riesgo de padecer enfermedades cardacas. Qu debo  saber sobre las pruebas de deteccin del cncer? Muchos tipos de cncer pueden detectarse de manera temprana y, a menudo, pueden prevenirse. Segn su historia clnica y sus antecedentes familiares, es posible que deba realizarse pruebas de deteccin del cncer en diferentes edades. Esto puede incluir pruebas de deteccin de lo siguiente: Building services engineer. Cncer de prstata. Cncer de piel. Cncer de pulmn. Qu debo saber sobre la enfermedad cardaca, la diabetes y la hipertensin arterial? Presin arterial y enfermedad cardaca La hipertensin arterial causa enfermedades cardacas y [place] el riesgo de accidente cerebrovascular. Es ms probable que esto se manifieste en las personas que tienen lecturas de presin arterial alta o tienen sobrepeso. Hable con el mdico sobre sus valores de presin arterial deseados. Hgase controlar la presin arterial: Cada 3 a 5 aos si tiene entre 18 y 30 aos. Todos los aos si es mayor de 40 aos. Si tiene entre 65 y 65 aos y es fumador o Insurance underwriter, pregntele al mdico si debe realizarse una prueba de deteccin de aneurisma artico abdominal (AAA) por nica vez. Diabetes Realcese exmenes de deteccin de la diabetes con regularidad. Este anlisis revisa el nivel de azcar en la sangre en [place]. Hgase las pruebas de deteccin: Cada tres aos despus de los 45 aos de edad si tiene un peso normal y un bajo riesgo de padecer diabetes. Con ms frecuencia y a partir de [place] edad inferior si tiene sobrepeso o un alto riesgo de padecer diabetes. Qu debo saber sobre la prevencin de infecciones? Hepatitis B Si tiene un riesgo ms alto de contraer hepatitis B, debe someterse a un examen de deteccin de este virus. Hable con el mdico para averiguar si tiene riesgo de  contraer la infeccin por hepatitis B. Hepatitis C Se recomienda un anlisis de [place] para: Todos los que nacieron entre 1945 y [phone]. Todas las personas que tengan un riesgo de haber  contrado hepatitis C. Enfermedades de transmisin sexual (ETS) Debe realizarse pruebas de deteccin de ITS todos los aos, incluidas la gonorrea y la clamidia, si: Es sexualmente activo y es menor de [address]. Es mayor de [address], y Public affairs consultant informa que corre riesgo de tener este tipo de infecciones. La actividad sexual ha cambiado desde que le hicieron la ltima prueba de deteccin y tiene un riesgo mayor de Warehouse manager clamidia o Copy. Pregntele al mdico si usted tiene riesgo. Pregntele al mdico si usted tiene un alto riesgo de Primary school teacher VIH. El mdico tambin puede recomendarle un medicamento recetado para ayudar a evitar la infeccin por el VIH. Si elige tomar medicamentos para prevenir el VIH, primero debe [employer] de deteccin del VIH. Luego debe hacerse anlisis cada 3 meses mientras est tomando los medicamentos. Siga estas indicaciones en su casa: Consumo de alcohol No beba alcohol si el mdico se lo prohbe. Si bebe alcohol: Limite la cantidad que consume de 0 a 2 bebidas por da. Sepa cunta cantidad de alcohol hay en las bebidas que toma. En los [address], una medida equivale a una botella de cerveza de 12 oz (355 ml), un vaso de vino de 5 oz (148 ml) o un vaso de una bebida alcohlica de alta graduacin de 1 oz (44 ml). Estilo de vida No consuma ningn producto que contenga nicotina o tabaco. Estos productos incluyen cigarrillos, tabaco para Theatre manager y aparatos de vapeo, como los Administrator, Civil Service. Si necesita ayuda para dejar de consumir estos productos, consulte al mdico. No consuma drogas. No comparta agujas. Solicite ayuda a su mdico si necesita apoyo o informacin para abandonar las drogas. Indicaciones generales Realcese los estudios de rutina de [address], dentales y de Wellsite geologist. Mantngase al da con las vacunas. Infrmele a su mdico si: Se siente deprimido con frecuencia. Alguna vez ha sido vctima de [place] o no se siente seguro en su  casa. Resumen Adoptar un estilo de vida saludable y recibir atencin preventiva son importantes para promover la salud y Counsellor. Siga las instrucciones del mdico acerca de una dieta saludable, el ejercicio y la realizacin de pruebas o exmenes para Hotel manager. Siga las instrucciones del mdico con respecto al control del colesterol y la presin arterial. Esta informacin no tiene Theme park manager el consejo del mdico. Asegrese de hacerle al mdico cualquier pregunta que tenga. Document Revised: 08/27/2020 Document Reviewed: 08/27/2020 Elsevier Patient Education  2024 [employer].

## 2022-11-01 NOTE — Progress Notes (Signed)
Robert Dudley 62 y.o.   Chief Complaint  Patient presents with   Annual Exam    No concerns     HISTORY OF PRESENT ILLNESS: This is a 62 y.o. male A79A from [place] here for annual exam. Staying healthy. Has no complaints or medical concerns today.   HPI   Prior to Admission medications   Medication Sig Start Date End Date Taking? Authorizing Provider  acetaminophen (TYLENOL) 325 MG tablet Take 650 mg by mouth every 6 (six) hours as needed.   Yes [provider]  fexofenadine (ALLEGRA) 180 MG tablet Take 180 mg by mouth daily.   Yes [provider]  OMEPRAZOLE PO Take by mouth as needed.   Yes [provider]  COLLAGEN PO Take by mouth. Patient not taking: Reported on 09/30/2020    [provider]    No Known Allergies  Patient Active Problem List   Diagnosis Date Noted   Prediabetes 08/21/2019   Gastroesophageal reflux disease without esophagitis 04/03/2018    No past medical history on file.  Past Surgical History:  Procedure Laterality Date   APPENDECTOMY  62    Social History   Socioeconomic History   Marital status: Married    Spouse name: Not on file   Number of children: Not on file   Years of education: Not on file   Highest education level: Not on file  Occupational History   Not on file  Tobacco Use   Smoking status: Never   Smokeless tobacco: Never  Substance and Sexual Activity   Alcohol use: Yes    Comment: sometimes   Drug use: Never   Sexual activity: Not on file  Other Topics Concern   Not on file  Social History Narrative   Not on file   Social Determinants of Health   Financial Resource Strain: Not on file  Food Insecurity: Not on file  Transportation Needs: Not on file  Physical Activity: Not on file  Stress: Not on file  Social Connections: Not on file  Intimate Partner Violence: Not on file    Family History  Problem Relation Age of Onset   Arthritis Mother    Heart disease  Father    Arthritis Sister      Review of Systems  Constitutional: Negative.  Negative for chills and fever.  HENT: Negative.  Negative for congestion and sore throat.   Respiratory: Negative.  Negative for cough and shortness of breath.   Cardiovascular: Negative.  Negative for chest pain and palpitations.  Gastrointestinal:  Positive for heartburn (Occasional episodes.  History of GERD). Negative for abdominal pain, diarrhea, nausea and vomiting.  Genitourinary: Negative.   Musculoskeletal: Negative.   Skin: Negative.  Negative for rash.  Neurological:  Negative for dizziness and headaches.  All other systems reviewed and are negative.   Vitals:   11/01/22 0847  BP: 112/72  Pulse: 77  Temp: 98 F (36.7 C)  SpO2: 96%    Physical Exam Vitals reviewed.  Constitutional:      Appearance: Normal appearance.  HENT:     Head: Normocephalic.     Right Ear: Tympanic membrane, ear canal and external ear normal.     Left Ear: Tympanic membrane, ear canal and external ear normal.     Mouth/Throat:     Mouth: Mucous membranes are moist.     Pharynx: Oropharynx is clear.  Eyes:     Extraocular Movements: Extraocular movements intact.     Conjunctiva/sclera: Conjunctivae normal.  Pupils: Pupils are equal, round, and reactive to light.  Cardiovascular:     Rate and Rhythm: Normal rate and regular rhythm.     Pulses: Normal pulses.     Heart sounds: Normal heart sounds.  Pulmonary:     Effort: Pulmonary effort is normal.     Breath sounds: Normal breath sounds.  Abdominal:     Palpations: Abdomen is soft.     Tenderness: There is no abdominal tenderness.  Musculoskeletal:     Cervical back: No tenderness.  Lymphadenopathy:     Cervical: No cervical adenopathy.  Skin:    General: Skin is warm and dry.     Capillary Refill: Capillary refill takes less than 2 seconds.  Neurological:     General: No focal deficit present.     Mental Status: He is alert and oriented to  person, place, and time.  Psychiatric:        Mood and Affect: Mood normal.        Behavior: Behavior normal.      ASSESSMENT & PLAN: Problem List Items Addressed This Visit   None Visit Diagnoses     Routine general medical examination at a health care facility    -  Primary   Relevant Orders   CBC with Differential   Comprehensive metabolic panel   Hemoglobin A1c   Lipid panel   PSA(Must document that pt has been informed of limitations of PSA testing.)   Screening for prostate cancer       Relevant Orders   PSA(Must document that pt has been informed of limitations of PSA testing.)   Screening for deficiency anemia       Relevant Orders   CBC with Differential   Screening for lipoid disorders       Relevant Orders   Lipid panel   Screening for endocrine, metabolic and immunity disorder       Relevant Orders   Comprehensive metabolic panel   Hemoglobin A1c      Modifiable risk factors discussed with patient. Anticipatory guidance according to age provided. The following topics were also discussed: Social Determinants of Health Smoking.  Non-smoker Diet and nutrition.  Good eating habits Benefits of exercise Cancer screening.  Last colonoscopy 2018 by [facility] physicians.  Told to return in 10 years Vaccinations review and recommendations Cardiovascular risk assessment The 10-year ASCVD risk score (Arnett DK, et al., 2019) is: 5.1%   Values used to calculate the score:     Age: 47 years     Sex: Male     Is Non-Hispanic African American: No     Diabetic: No     Tobacco smoker: No     Systolic Blood Pressure: 112 mmHg     Is BP treated: No     HDL Cholesterol: 54.5 mg/dL     Total Cholesterol: 156 mg/dL  Mental health including depression and anxiety Fall and accident prevention  Patient Instructions  Mantenimiento de Radiographer, therapeutic en los hombres Health Maintenance, Male Adoptar un estilo de vida saludable y recibir atencin preventiva son importantes para  promover la salud y Counsellor. Consulte al mdico sobre: El esquema adecuado para hacerse pruebas y exmenes peridicos. Cosas que puede hacer por su cuenta para prevenir enfermedades y [place] sano. Qu debo saber sobre la dieta, el peso y el ejercicio? Consuma una dieta saludable  Consuma una dieta que incluya muchas verduras, frutas, productos lcteos con bajo contenido de [place] y Associate Professor. No consuma muchos alimentos  ricos en grasas slidas, azcares agregados o sodio. Mantenga un peso saludable El ndice de masa muscular [facility]) es una medida que puede utilizarse para identificar posibles problemas de [place]. Proporciona una estimacin de la grasa corporal basndose en el peso y la altura. Su mdico puede ayudarle a Engineer, site IMC y a Personnel officer o Pharmacologist un peso saludable. Haga ejercicio con regularidad Haga ejercicio con regularidad. Esta es una de las prcticas ms importantes que puede hacer por su salud. La [employer] de los adultos deben seguir estas pautas: Education officer, environmental, al menos, 150 minutos de actividad fsica por semana. El ejercicio debe aumentar la frecuencia cardaca y Media planner transpirar (ejercicio de intensidad moderada). Hacer ejercicios de fortalecimiento por lo [facility] por semana. Agregue esto a su plan de ejercicio de intensidad moderada. Pase menos tiempo sentado. Incluso la actividad fsica ligera puede ser beneficiosa. Controle sus niveles de colesterol y lpidos en la sangre Comience a realizarse anlisis de lpidos y Oncologist en la sangre a los 20 aos y luego reptalos cada 5 aos. Es posible que Insurance underwriter los niveles de colesterol con mayor frecuencia si: Sus niveles de lpidos y colesterol son altos. Es mayor de 40 aos. Presenta un alto riesgo de padecer enfermedades cardacas. Qu debo saber sobre las pruebas de deteccin del cncer? Muchos tipos de cncer pueden detectarse de manera temprana y, a menudo, pueden prevenirse. Segn su  historia clnica y sus antecedentes familiares, es posible que deba realizarse pruebas de deteccin del cncer en diferentes edades. Esto puede incluir pruebas de deteccin de lo siguiente: Building services engineer. Cncer de prstata. Cncer de piel. Cncer de pulmn. Qu debo saber sobre la enfermedad cardaca, la diabetes y la hipertensin arterial? Presin arterial y enfermedad cardaca La hipertensin arterial causa enfermedades cardacas y [place] el riesgo de accidente cerebrovascular. Es ms probable que esto se manifieste en las personas que tienen lecturas de presin arterial alta o tienen sobrepeso. Hable con el mdico sobre sus valores de presin arterial deseados. Hgase controlar la presin arterial: Cada 3 a 5 aos si tiene entre 18 y 69 aos. Todos los aos si es mayor de 40 aos. Si tiene entre 65 y 56 aos y es fumador o Insurance underwriter, pregntele al mdico si debe realizarse una prueba de deteccin de aneurisma artico abdominal (AAA) por nica vez. Diabetes Realcese exmenes de deteccin de la diabetes con regularidad. Este anlisis revisa el nivel de azcar en la sangre en [place]. Hgase las pruebas de deteccin: Cada tres aos despus de los 45 aos de edad si tiene un peso normal y un bajo riesgo de padecer diabetes. Con ms frecuencia y a partir de [place] edad inferior si tiene sobrepeso o un alto riesgo de padecer diabetes. Qu debo saber sobre la prevencin de infecciones? Hepatitis B Si tiene un riesgo ms alto de contraer hepatitis B, debe someterse a un examen de deteccin de este virus. Hable con el mdico para averiguar si tiene riesgo de contraer la infeccin por hepatitis B. Hepatitis C Se recomienda un anlisis de [place] para: Todos los que nacieron entre 1945 y [phone]. Todas las personas que tengan un riesgo de haber contrado hepatitis C. Enfermedades de transmisin sexual (ETS) Debe realizarse pruebas de deteccin de ITS todos los aos, incluidas la gonorrea y la  clamidia, si: Es sexualmente activo y es menor de [address]. Es mayor de [address], y Public affairs consultant informa que corre riesgo de tener este tipo de infecciones. La actividad sexual ha [place] desde que  le hicieron la ltima prueba de deteccin y tiene un riesgo mayor de tener clamidia o Copy. Pregntele al mdico si usted tiene riesgo. Pregntele al mdico si usted tiene un alto riesgo de Primary school teacher VIH. El mdico tambin puede recomendarle un medicamento recetado para ayudar a evitar la infeccin por el VIH. Si elige tomar medicamentos para prevenir el VIH, primero debe [employer] de deteccin del VIH. Luego debe hacerse anlisis cada 3 meses mientras est tomando los medicamentos. Siga estas indicaciones en su casa: Consumo de alcohol No beba alcohol si el mdico se lo prohbe. Si bebe alcohol: Limite la cantidad que consume de 0 a 2 bebidas por da. Sepa cunta cantidad de alcohol hay en las bebidas que toma. En los [address], una medida equivale a una botella de cerveza de 12 oz (355 ml), un vaso de vino de 5 oz (148 ml) o un vaso de una bebida alcohlica de alta graduacin de 1 oz (44 ml). Estilo de vida No consuma ningn producto que contenga nicotina o tabaco. Estos productos incluyen cigarrillos, tabaco para Theatre manager y aparatos de vapeo, como los Administrator, Civil Service. Si necesita ayuda para dejar de consumir estos productos, consulte al mdico. No consuma drogas. No comparta agujas. Solicite ayuda a su mdico si necesita apoyo o informacin para abandonar las drogas. Indicaciones generales Realcese los estudios de rutina de [address], dentales y de Wellsite geologist. Mantngase al da con las vacunas. Infrmele a su mdico si: Se siente deprimido con frecuencia. Alguna vez ha sido vctima de [place] o no se siente seguro en su casa. Resumen Adoptar un estilo de vida saludable y recibir atencin preventiva son importantes para promover la salud y Counsellor. Siga las  instrucciones del mdico acerca de una dieta saludable, el ejercicio y la realizacin de pruebas o exmenes para Hotel manager. Siga las instrucciones del mdico con respecto al control del colesterol y la presin arterial. Esta informacin no tiene Theme park manager el consejo del mdico. Asegrese de hacerle al mdico cualquier pregunta que tenga. Document Revised: 08/27/2020 Document Reviewed: 08/27/2020 Elsevier Patient Education  2024 Elsevier Inc.    Edwina Barth, MD [facility]

## 2023-11-03 ENCOUNTER — Ambulatory Visit: Payer: Self-pay | Admitting: Emergency Medicine

## 2023-11-03 ENCOUNTER — Ambulatory Visit (INDEPENDENT_AMBULATORY_CARE_PROVIDER_SITE_OTHER): Payer: BC Managed Care – PPO | Admitting: Emergency Medicine

## 2023-11-03 ENCOUNTER — Encounter: Payer: Self-pay | Admitting: Emergency Medicine

## 2023-11-03 VITALS — BP 120/70 | HR 67 | Temp 97.6°F | Ht 67.0 in | Wt 169.0 lb

## 2023-11-03 DIAGNOSIS — R1319 Other dysphagia: Secondary | ICD-10-CM | POA: Diagnosis not present

## 2023-11-03 DIAGNOSIS — Z13220 Encounter for screening for lipoid disorders: Secondary | ICD-10-CM | POA: Diagnosis not present

## 2023-11-03 DIAGNOSIS — K219 Gastro-esophageal reflux disease without esophagitis: Secondary | ICD-10-CM | POA: Diagnosis not present

## 2023-11-03 DIAGNOSIS — R399 Unspecified symptoms and signs involving the genitourinary system: Secondary | ICD-10-CM

## 2023-11-03 DIAGNOSIS — Z0000 Encounter for general adult medical examination without abnormal findings: Secondary | ICD-10-CM | POA: Diagnosis not present

## 2023-11-03 DIAGNOSIS — Z125 Encounter for screening for malignant neoplasm of prostate: Secondary | ICD-10-CM

## 2023-11-03 DIAGNOSIS — Z1329 Encounter for screening for other suspected endocrine disorder: Secondary | ICD-10-CM

## 2023-11-03 DIAGNOSIS — Z130 Encounter for screening for diseases of the blood and blood-forming organs and certain disorders involving the immune mechanism: Secondary | ICD-10-CM

## 2023-11-03 DIAGNOSIS — Z13228 Encounter for screening for other metabolic disorders: Secondary | ICD-10-CM

## 2023-11-03 DIAGNOSIS — R972 Elevated prostate specific antigen [PSA]: Secondary | ICD-10-CM

## 2023-11-03 DIAGNOSIS — R7303 Prediabetes: Secondary | ICD-10-CM

## 2023-11-03 DIAGNOSIS — Z0001 Encounter for general adult medical examination with abnormal findings: Secondary | ICD-10-CM

## 2023-11-03 LAB — PSA: PSA: 9.67 ng/mL — ABNORMAL HIGH (ref 0.10–4.00)

## 2023-11-03 LAB — CBC WITH DIFFERENTIAL/PLATELET
Basophils Absolute: 0.1 K/uL (ref 0.0–0.1)
Basophils Relative: 1.2 % (ref 0.0–3.0)
Eosinophils Absolute: 0 K/uL (ref 0.0–0.7)
Eosinophils Relative: 0.5 % (ref 0.0–5.0)
HCT: 42.6 % (ref 39.0–52.0)
Hemoglobin: 14.4 g/dL (ref 13.0–17.0)
Lymphocytes Relative: 26.1 % (ref 12.0–46.0)
Lymphs Abs: 1.4 K/uL (ref 0.7–4.0)
MCHC: 33.9 g/dL (ref 30.0–36.0)
MCV: 93.9 fl (ref 78.0–100.0)
Monocytes Absolute: 0.3 K/uL (ref 0.1–1.0)
Monocytes Relative: 6.4 % (ref 3.0–12.0)
Neutro Abs: 3.5 K/uL (ref 1.4–7.7)
Neutrophils Relative %: 65.8 % (ref 43.0–77.0)
Platelets: 186 K/uL (ref 150.0–400.0)
RBC: 4.54 Mil/uL (ref 4.22–5.81)
RDW: 13.2 % (ref 11.5–15.5)
WBC: 5.3 K/uL (ref 4.0–10.5)

## 2023-11-03 LAB — URINALYSIS
Bilirubin Urine: NEGATIVE
Ketones, ur: 15 — AB
Leukocytes,Ua: NEGATIVE
Nitrite: NEGATIVE
Specific Gravity, Urine: 1.02 (ref 1.000–1.030)
Total Protein, Urine: NEGATIVE
Urine Glucose: NEGATIVE
Urobilinogen, UA: 1 (ref 0.0–1.0)
pH: 6 (ref 5.0–8.0)

## 2023-11-03 LAB — COMPREHENSIVE METABOLIC PANEL WITH GFR
ALT: 20 U/L (ref 0–53)
AST: 23 U/L (ref 0–37)
Albumin: 4.6 g/dL (ref 3.5–5.2)
Alkaline Phosphatase: 67 U/L (ref 39–117)
BUN: 19 mg/dL (ref 6–23)
CO2: 29 meq/L (ref 19–32)
Calcium: 9.2 mg/dL (ref 8.4–10.5)
Chloride: 102 meq/L (ref 96–112)
Creatinine, Ser: 1.04 mg/dL (ref 0.40–1.50)
GFR: 77.28 mL/min (ref >60.00)
Glucose, Bld: 116 mg/dL — ABNORMAL HIGH (ref 70–99)
Potassium: 4.4 meq/L (ref 3.5–5.1)
Sodium: 138 meq/L (ref 135–145)
Total Bilirubin: 0.8 mg/dL (ref 0.2–1.2)
Total Protein: 7.4 g/dL (ref 6.0–8.3)

## 2023-11-03 LAB — LIPID PANEL
Cholesterol: 167 mg/dL (ref 0–200)
HDL: 58 mg/dL (ref >39.00)
LDL Cholesterol: 93 mg/dL (ref 0–99)
NonHDL: 109.28
Total CHOL/HDL Ratio: 3
Triglycerides: 80 mg/dL (ref 0.0–149.0)
VLDL: 16 mg/dL (ref 0.0–40.0)

## 2023-11-03 LAB — HEMOGLOBIN A1C: Hgb A1c MFr Bld: 6.4 % (ref 4.6–6.5)

## 2023-11-03 MED ORDER — TAMSULOSIN HCL 0.4 MG PO CAPS: 0.4000 mg | ORAL_CAPSULE | Freq: Every day | ORAL | 3 refills | Status: DC

## 2023-11-03 NOTE — Assessment & Plan Note (Signed)
 Active and affecting quality of life We will do PSA today Recommend to start Flomax  0.4 mg at bedtime May need urology referral.

## 2023-11-03 NOTE — Progress Notes (Signed)
 Pearson Mantle 63 y.o.   Chief Complaint  Patient presents with   Annual Exam    HISTORY OF PRESENT ILLNESS: This is a 63 y.o. male here for annual exam. Also complaining of occasional dysphagia to solids and sharp pain when food is going down for the last several weeks Has seen some blood in the stools.  Has history of hemorrhoids Also complaining of lower urinary tract symptoms with urinary frequency and unable to fully empty bladder No other complaints or medical concerns today.  HPI   Prior to Admission medications   Medication Sig Start Date End Date Taking? Authorizing Provider  acetaminophen (TYLENOL) 325 MG tablet Take 650 mg by mouth every 6 (six) hours as needed.   Yes [provider]  fexofenadine (ALLEGRA) 180 MG tablet Take 180 mg by mouth daily.   Yes [provider]  COLLAGEN PO Take by mouth. Patient not taking: Reported on 11/03/2023    [provider]  OMEPRAZOLE PO Take by mouth as needed. Patient not taking: Reported on 11/03/2023    [provider]    No Known Allergies  Patient Active Problem List   Diagnosis Date Noted   Prediabetes 08/21/2019   Gastroesophageal reflux disease without esophagitis 04/03/2018    No past medical history on file.  Past Surgical History:  Procedure Laterality Date   APPENDECTOMY  79    Social History   Socioeconomic History   Marital status: Married    Spouse name: Not on file   Number of children: Not on file   Years of education: Not on file   Highest education level: Not on file  Occupational History   Not on file  Tobacco Use   Smoking status: Never   Smokeless tobacco: Never  Substance and Sexual Activity   Alcohol use: Yes    Comment: sometimes   Drug use: Never   Sexual activity: Not on file  Other Topics Concern   Not on file  Social History Narrative   Not on file   Social Drivers of Health   Financial Resource Strain: Not on file  Food Insecurity: Not on  file  Transportation Needs: Not on file  Physical Activity: Not on file  Stress: Not on file  Social Connections: Not on file  Intimate Partner Violence: Not on file    Family History  Problem Relation Age of Onset   Arthritis Mother    Heart disease Father    Arthritis Sister      Review of Systems  Constitutional: Negative.  Negative for chills, fever and weight loss.  HENT: Negative.  Negative for congestion and sore throat.   Respiratory: Negative.  Negative for cough and hemoptysis.   Gastrointestinal:        Dysphagia  Genitourinary:  Positive for frequency.  Skin: Negative.   All other systems reviewed and are negative.   Vitals:   11/03/23 0854  BP: 120/70  Pulse: 67  Temp: 97.6 F (36.4 C)  SpO2: 98%    Physical Exam Vitals reviewed.  Constitutional:      Appearance: Normal appearance.  HENT:     Head: Normocephalic.     Right Ear: Tympanic membrane, ear canal and external ear normal.     Left Ear: Tympanic membrane, ear canal and external ear normal.     Mouth/Throat:     Mouth: Mucous membranes are moist.     Pharynx: Oropharynx is clear.  Eyes:     Extraocular Movements: Extraocular movements  intact.     Conjunctiva/sclera: Conjunctivae normal.     Pupils: Pupils are equal, round, and reactive to light.  Cardiovascular:     Rate and Rhythm: Normal rate and regular rhythm.     Pulses: Normal pulses.     Heart sounds: Normal heart sounds.  Pulmonary:     Effort: Pulmonary effort is normal.     Breath sounds: Normal breath sounds.  Abdominal:     Palpations: Abdomen is soft.     Tenderness: There is no abdominal tenderness.  Musculoskeletal:     Cervical back: No tenderness.  Lymphadenopathy:     Cervical: No cervical adenopathy.  Skin:    General: Skin is warm and dry.     Capillary Refill: Capillary refill takes less than 2 seconds.  Neurological:     General: No focal deficit present.     Mental Status: He is alert and oriented to  person, place, and time.  Psychiatric:        Mood and Affect: Mood normal.        Behavior: Behavior normal.      ASSESSMENT & PLAN: Problem List Items Addressed This Visit       Digestive   Gastroesophageal reflux disease without esophagitis   Occasional heartburn symptoms Takes omeprazole as needed      Relevant Orders   CBC with Differential/Platelet   Esophageal dysphagia   History of esophageal stricture Never had upper endoscopy.  Diagnosed with barium swallow test Persistent symptoms affecting quality of life Needs upper endoscopy.  Referral to GI placed today. Has history of GERD      Relevant Orders   Ambulatory referral to Gastroenterology     Other   Prediabetes   Diet and nutrition discussed Hemoglobin A1c done today Cardiovascular risks associated with diabetes discussed      Relevant Orders   Hemoglobin A1c   Lower urinary tract symptoms   Active and affecting quality of life We will do PSA today Recommend to start Flomax  0.4 mg at bedtime May need urology referral.      Relevant Medications   tamsulosin  (FLOMAX ) 0.4 MG CAPS capsule   Other Relevant Orders   PSA   Urinalysis   Other Visit Diagnoses       Encounter for general adult medical examination with abnormal findings    -  Primary   Relevant Orders   Comprehensive metabolic panel with GFR   CBC with Differential/Platelet   PSA   Hemoglobin A1c   Lipid panel   Urinalysis     Screening for deficiency anemia       Relevant Orders   CBC with Differential/Platelet     Screening for lipoid disorders       Relevant Orders   Lipid panel     Screening for endocrine, metabolic and immunity disorder       Relevant Orders   Comprehensive metabolic panel with GFR     Screening for prostate cancer       Relevant Orders   PSA      Modifiable risk factors discussed with patient. Anticipatory guidance according to age provided. The following topics were also discussed: Social  Determinants of Health Smoking.  Non-smoker Diet and nutrition Benefits of exercise Cancer screening and need for colon cancer screening.  Patient states he had a colonoscopy in 2018 and was told to come back in 10 years.  Report not available for review. Vaccinations review and recommendations Cardiovascular risk assessment The 10-year  ASCVD risk score (Arnett DK, et al., 2019) is: 6.6%   Values used to calculate the score:     Age: 13 years     Clincally relevant sex: Male     Is Non-Hispanic African American: No     Diabetic: No     Tobacco smoker: No     Systolic Blood Pressure: 120 mmHg     Is BP treated: No     HDL Cholesterol: 60.5 mg/dL     Total Cholesterol: 176 mg/dL Review of chronic medical conditions and all medications Mental health including depression and anxiety Fall and accident prevention  Patient Instructions  Mantenimiento de Research officer, political party, Male Adoptar un estilo de vida saludable y recibir atencin preventiva son importantes para promover la salud y Counsellor. Consulte al mdico sobre: El esquema adecuado para hacerse pruebas y exmenes peridicos. Cosas que puede hacer por su cuenta para prevenir enfermedades y [place] sano. Qu debo saber sobre la dieta, el peso y el ejercicio? Consuma una dieta saludable  Consuma una dieta que incluya muchas verduras, frutas, productos lcteos con bajo contenido de grasa y protenas magras. No consuma muchos alimentos ricos en grasas slidas, azcares agregados o sodio. Mantenga un peso saludable El ndice de masa muscular [facility]) es una medida que puede utilizarse para identificar posibles problemas de [place]. Proporciona una estimacin de la grasa corporal basndose en el peso y la altura. Su mdico puede ayudarle a determinar su IMC y a Personnel officer o Pharmacologist un peso saludable. Haga ejercicio con regularidad Haga ejercicio con regularidad. Esta es una de las prcticas ms importantes que puede  hacer por su salud. La [employer] de los adultos deben seguir estas pautas: Education officer, environmental, al menos, 150 minutos de actividad fsica por semana. El ejercicio debe aumentar la frecuencia cardaca y Media planner transpirar (ejercicio de intensidad moderada). Hacer ejercicios de fortalecimiento por lo [facility] por semana. Agregue esto a su plan de ejercicio de intensidad moderada. Pase menos tiempo sentado. Incluso la actividad fsica ligera puede ser beneficiosa. Controle sus niveles de colesterol y lpidos en la sangre Comience a realizarse anlisis de lpidos y Oncologist en la sangre a los 20 aos y luego reptalos cada 5 aos. Es posible que Insurance underwriter los niveles de colesterol con mayor frecuencia si: Sus niveles de lpidos y colesterol son altos. Es mayor de 40 aos. Presenta un alto riesgo de padecer enfermedades cardacas. Qu debo saber sobre las pruebas de deteccin del cncer? Muchos tipos de cncer pueden detectarse de manera temprana y, a menudo, pueden prevenirse. Segn su historia clnica y sus antecedentes familiares, es posible que deba realizarse pruebas de deteccin del cncer en diferentes edades. Esto puede incluir pruebas de deteccin de lo siguiente: Building services engineer. Cncer de prstata. Cncer de piel. Cncer de pulmn. Qu debo saber sobre la enfermedad cardaca, la diabetes y la hipertensin arterial? Presin arterial y enfermedad cardaca La hipertensin arterial causa enfermedades cardacas y lesotho el riesgo de accidente cerebrovascular. Es ms probable que esto se manifieste en las personas que tienen lecturas de presin arterial alta o tienen sobrepeso. Hable con el mdico sobre sus valores de presin arterial deseados. Hgase controlar la presin arterial: Cada 3 a 5 aos si tiene entre 18 y 44 aos. Todos los aos si es mayor de 40 aos. Si tiene entre 65 y 35 aos y es fumador o Insurance underwriter, pregntele al mdico si debe realizarse una prueba de deteccin  de aneurisma artico abdominal (AAA) por  nica vez. Diabetes Realcese exmenes de deteccin de la diabetes con regularidad. Este anlisis revisa el nivel de azcar en la sangre en [place]. Hgase las pruebas de deteccin: Cada tres aos despus de los 45 aos de edad si tiene un peso normal y un bajo riesgo de padecer diabetes. Con ms frecuencia y a partir de [place] edad inferior si tiene sobrepeso o un alto riesgo de padecer diabetes. Qu debo saber sobre la prevencin de infecciones? Hepatitis B Si tiene un riesgo ms alto de contraer hepatitis B, debe someterse a un examen de deteccin de este virus. Hable con el mdico para averiguar si tiene riesgo de contraer la infeccin por hepatitis B. Hepatitis C Se recomienda un anlisis de [place] para: Todos los que nacieron entre 1945 y [phone]. Todas las personas que tengan un riesgo de haber contrado hepatitis C. Enfermedades de transmisin sexual (ETS) Debe realizarse pruebas de deteccin de ITS todos los aos, incluidas la gonorrea y la clamidia, si: Es sexualmente activo y es menor de [address]. Es mayor de [address], y Public affairs consultant informa que corre riesgo de tener este tipo de infecciones. La actividad sexual ha cambiado desde que le hicieron la ltima prueba de deteccin y tiene un riesgo mayor de tener clamidia o Copy. Pregntele al mdico si usted tiene riesgo. Pregntele al mdico si usted tiene un alto riesgo de Primary school teacher VIH. El mdico tambin puede recomendarle un medicamento recetado para ayudar a evitar la infeccin por el VIH. Si elige tomar medicamentos para prevenir el VIH, primero debe [employer] de deteccin del VIH. Luego debe hacerse anlisis cada 3 meses mientras est tomando los medicamentos. Siga estas indicaciones en su casa: Consumo de alcohol No beba alcohol si el mdico se lo prohbe. Si bebe alcohol: Limite la cantidad que consume de 0 a 2 bebidas por da. Sepa cunta cantidad de alcohol hay en las bebidas que  toma. En los [address], una medida equivale a una botella de cerveza de 12 oz (355 ml), un vaso de vino de 5 oz (148 ml) o un vaso de una bebida alcohlica de alta graduacin de 1 oz (44 ml). Estilo de vida No consuma ningn producto que contenga nicotina o tabaco. Estos productos incluyen cigarrillos, tabaco para Theatre manager y aparatos de vapeo, como los cigarrillos electrnicos. Si necesita ayuda para dejar de consumir estos productos, consulte al mdico. No consuma drogas. No comparta agujas. Solicite ayuda a su mdico si necesita apoyo o informacin para abandonar las drogas. Indicaciones generales Realcese los estudios de rutina de [address], dentales y de Wellsite geologist. Mantngase al da con las vacunas. Infrmele a su mdico si: Se siente deprimido con frecuencia. Alguna vez ha sido vctima de [place] o no se siente seguro en su casa. Resumen Adoptar un estilo de vida saludable y recibir atencin preventiva son importantes para promover la salud y Counsellor. Siga las instrucciones del mdico acerca de una dieta saludable, el ejercicio y la realizacin de pruebas o exmenes para Hotel manager. Siga las instrucciones del mdico con respecto al control del colesterol y la presin arterial. Esta informacin no tiene Theme park manager el consejo del mdico. Asegrese de hacerle al mdico cualquier pregunta que tenga. Document Revised: 08/27/2020 Document Reviewed: 08/27/2020 Elsevier Patient Education  2024 Elsevier Inc.      Emil Schaumann, MD [facility]

## 2023-11-03 NOTE — Assessment & Plan Note (Signed)
 History of esophageal stricture Never had upper endoscopy.  Diagnosed with barium swallow test Persistent symptoms affecting quality of life Needs upper endoscopy.  Referral to GI placed today. Has history of GERD

## 2023-11-03 NOTE — Assessment & Plan Note (Signed)
 Occasional heartburn symptoms Takes omeprazole as needed

## 2023-11-03 NOTE — Patient Instructions (Signed)
 Mantenimiento de Research officer, political party, Male Adoptar un estilo de vida saludable y recibir atencin preventiva son importantes para promover la salud y Counsellor. Consulte al mdico sobre: El esquema adecuado para hacerse pruebas y exmenes peridicos. Cosas que puede hacer por su cuenta para prevenir enfermedades y [place] sano. Qu debo saber sobre la dieta, el peso y el ejercicio? Consuma una dieta saludable  Consuma una dieta que incluya muchas verduras, frutas, productos lcteos con bajo contenido de [place] y Associate Professor. No consuma muchos alimentos ricos en grasas slidas, azcares agregados o sodio. Mantenga un peso saludable El ndice de masa muscular [facility]) es una medida que puede utilizarse para identificar posibles problemas de [place]. Proporciona una estimacin de la grasa corporal basndose en el peso y la altura. Su mdico puede ayudarle a Engineer, site IMC y a Personnel officer o Pharmacologist un peso saludable. Haga ejercicio con regularidad Haga ejercicio con regularidad. Esta es una de las prcticas ms importantes que puede hacer por su salud. La [employer] de los adultos deben seguir estas pautas: Education officer, environmental, al menos, 150 minutos de actividad fsica por semana. El ejercicio debe aumentar la frecuencia cardaca y Media planner transpirar (ejercicio de intensidad moderada). Hacer ejercicios de fortalecimiento por lo [facility] por semana. Agregue esto a su plan de ejercicio de intensidad moderada. Pase menos tiempo sentado. Incluso la actividad fsica ligera puede ser beneficiosa. Controle sus niveles de colesterol y lpidos en la sangre Comience a realizarse anlisis de lpidos y Oncologist en la sangre a los 20 aos y luego reptalos cada 5 aos. Es posible que Insurance underwriter los niveles de colesterol con mayor frecuencia si: Sus niveles de lpidos y colesterol son altos. Es mayor de 40 aos. Presenta un alto riesgo de padecer enfermedades cardacas. Qu debo  saber sobre las pruebas de deteccin del cncer? Muchos tipos de cncer pueden detectarse de manera temprana y, a menudo, pueden prevenirse. Segn su historia clnica y sus antecedentes familiares, es posible que deba realizarse pruebas de deteccin del cncer en diferentes edades. Esto puede incluir pruebas de deteccin de lo siguiente: Building services engineer. Cncer de prstata. Cncer de piel. Cncer de pulmn. Qu debo saber sobre la enfermedad cardaca, la diabetes y la hipertensin arterial? Presin arterial y enfermedad cardaca La hipertensin arterial causa enfermedades cardacas y [place] el riesgo de accidente cerebrovascular. Es ms probable que esto se manifieste en las personas que tienen lecturas de presin arterial alta o tienen sobrepeso. Hable con el mdico sobre sus valores de presin arterial deseados. Hgase controlar la presin arterial: Cada 3 a 5 aos si tiene entre 18 y 71 aos. Todos los aos si es mayor de 40 aos. Si tiene entre 65 y 26 aos y es fumador o Insurance underwriter, pregntele al mdico si debe realizarse una prueba de deteccin de aneurisma artico abdominal (AAA) por nica vez. Diabetes Realcese exmenes de deteccin de la diabetes con regularidad. Este anlisis revisa el nivel de azcar en la sangre en [place]. Hgase las pruebas de deteccin: Cada tres aos despus de los 45 aos de edad si tiene un peso normal y un bajo riesgo de padecer diabetes. Con ms frecuencia y a partir de [place] edad inferior si tiene sobrepeso o un alto riesgo de padecer diabetes. Qu debo saber sobre la prevencin de infecciones? Hepatitis B Si tiene un riesgo ms alto de contraer hepatitis B, debe someterse a un examen de deteccin de este virus. Hable con el mdico para averiguar si tiene riesgo de  contraer la infeccin por hepatitis B. Hepatitis C Se recomienda un anlisis de [place] para: Todos los que nacieron entre 1945 y [phone]. Todas las personas que tengan un riesgo de haber  contrado hepatitis C. Enfermedades de transmisin sexual (ETS) Debe realizarse pruebas de deteccin de ITS todos los aos, incluidas la gonorrea y la clamidia, si: Es sexualmente activo y es menor de [address]. Es mayor de [address], y Public affairs consultant informa que corre riesgo de tener este tipo de infecciones. La actividad sexual ha cambiado desde que le hicieron la ltima prueba de deteccin y tiene un riesgo mayor de Warehouse manager clamidia o Copy. Pregntele al mdico si usted tiene riesgo. Pregntele al mdico si usted tiene un alto riesgo de Primary school teacher VIH. El mdico tambin puede recomendarle un medicamento recetado para ayudar a evitar la infeccin por el VIH. Si elige tomar medicamentos para prevenir el VIH, primero debe [employer] de deteccin del VIH. Luego debe hacerse anlisis cada 3 meses mientras est tomando los medicamentos. Siga estas indicaciones en su casa: Consumo de alcohol No beba alcohol si el mdico se lo prohbe. Si bebe alcohol: Limite la cantidad que consume de 0 a 2 bebidas por da. Sepa cunta cantidad de alcohol hay en las bebidas que toma. En los [address], una medida equivale a una botella de cerveza de 12 oz (355 ml), un vaso de vino de 5 oz (148 ml) o un vaso de una bebida alcohlica de alta graduacin de 1 oz (44 ml). Estilo de vida No consuma ningn producto que contenga nicotina o tabaco. Estos productos incluyen cigarrillos, tabaco para Theatre manager y aparatos de vapeo, como los Administrator, Civil Service. Si necesita ayuda para dejar de consumir estos productos, consulte al mdico. No consuma drogas. No comparta agujas. Solicite ayuda a su mdico si necesita apoyo o informacin para abandonar las drogas. Indicaciones generales Realcese los estudios de rutina de [address], dentales y de Wellsite geologist. Mantngase al da con las vacunas. Infrmele a su mdico si: Se siente deprimido con frecuencia. Alguna vez ha sido vctima de [place] o no se siente seguro en su  casa. Resumen Adoptar un estilo de vida saludable y recibir atencin preventiva son importantes para promover la salud y Counsellor. Siga las instrucciones del mdico acerca de una dieta saludable, el ejercicio y la realizacin de pruebas o exmenes para Hotel manager. Siga las instrucciones del mdico con respecto al control del colesterol y la presin arterial. Esta informacin no tiene Theme park manager el consejo del mdico. Asegrese de hacerle al mdico cualquier pregunta que tenga. Document Revised: 08/27/2020 Document Reviewed: 08/27/2020 Elsevier Patient Education  2024 [employer].

## 2023-11-03 NOTE — Assessment & Plan Note (Signed)
Diet and nutrition discussed Hemoglobin A1c done today Cardiovascular risks associated with diabetes discussed

## 2023-11-04 NOTE — Telephone Encounter (Signed)
 Make sure referrals for gastrointestinal and urology went through please.  Thanks.

## 2023-12-26 ENCOUNTER — Encounter: Payer: Self-pay | Admitting: Physician Assistant

## 2024-02-15 NOTE — Progress Notes (Signed)
 Robert Console, PA-C [address] [place], [place]  [address] Phone: [phone]   Gastroenterology Consultation  Referring Provider:     Purcell Emil Schanz, * Primary Care Physician:  Purcell Emil Schanz, MD Primary Gastroenterologist:  Robert Console, PA-C / Dr. Gordy Starch  Reason for Consultation:     Dysphagia, GERD        HPI:   Discussed the use of AI scribe software for clinical note transcription with the patient, who gave verbal consent to proceed. History of Present Illness Robert Dudley is a 63 year old male, new patient, who presents with difficulty swallowing and acid reflux symptoms.  He experiences difficulty swallowing solid foods, describing a sensation of food getting 'stuck' in his throat and upper chest. This occurs intermittently, approximately two to three times a week, and is relieved by drinking water. No issues with swallowing liquids and no vomiting.  He has a history of acid reflux, previously experiencing heartburn and acid regurgitation, particularly at night. He is not currently taking omeprazole due to concerns about potential stomach damage from medications.  He underwent an upper endoscopy in 2016 and a colonoscopy in 2018, both at [facility].  We are requesting GI records.  Patient states the colonoscopy did not reveal any polyps. He also had a colonoscopy in [place]  in 1996.    He experiences occasional abdominal pain, which he associates with constipation. He uses Miralax, which provides some relief.  He denies weight loss or rectal bleeding.  No family history of gastrointestinal cancers such as colon, esophageal, or stomach cancer. No vomiting, significant weight loss, or current heartburn.  06/2014 upper GI series (evaluate upper abdominal pain and dyspepsia): 1. 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 2. Spontaneous gastroesophageal reflux. Small sliding-type hiatal hernia. 3. Otherwise  negative stomach and duodenum  Social history: He is married.  No children.  Never smoked.  Past Medical History:  Diagnosis Date   History of colon polyps     Past Surgical History:  Procedure Laterality Date   APPENDECTOMY  1983   [place]COPY  1998   [place]   COLONOSCOPY  11/02/2016   [facility]   ESOPHAGOGASTRODUODENOSCOPY  2016   [facility]    Prior to Admission medications   Medication Sig Start Date End Date Taking? Authorizing Provider  acetaminophen (TYLENOL) 325 MG tablet Take 650 mg by mouth every 6 (six) hours as needed.    [provider]  COLLAGEN PO Take by mouth. Patient not taking: Reported on 11/03/2023    [provider]  fexofenadine (ALLEGRA) 180 MG tablet Take 180 mg by mouth daily.    [provider]  OMEPRAZOLE PO Take by mouth as needed. Patient not taking: Reported on 11/03/2023    [provider]  tamsulosin  (FLOMAX ) 0.4 MG CAPS capsule Take 1 capsule (0.4 mg total) by mouth daily. 11/03/23   Purcell Emil Schanz, MD    Family History  Problem Relation Age of Onset   Arthritis Mother    Heart disease Father    Arthritis Sister      Social History   Tobacco Use   Smoking status: Never   Smokeless tobacco: Never  Vaping Use   Vaping status: Never Used  Substance Use Topics   Alcohol use: Yes    Comment: sometimes   Drug use: Never    Allergies as of 02/16/2024   (No Known Allergies)    Review of Systems:  All systems reviewed and negative except where noted in HPI.   Physical Exam:  BP (!) 110/58   Pulse 64   Ht 5' 7 (1.702 m)   Wt 170 lb (77.1 kg)   BMI 26.63 kg/m  No LMP for male patient.  General:   Alert,  Well-developed, well-nourished, pleasant and cooperative in NAD Lungs:  Respirations even and unlabored.  Clear throughout to auscultation.   No wheezes, crackles, or rhonchi. No acute distress. Heart:  Regular rate and rhythm; no murmurs, clicks, rubs, or gallops. Abdomen:  Normal  bowel sounds.  No bruits.  Soft, and non-distended without masses, hepatosplenomegaly or hernias noted.  No Tenderness.  No guarding or rebound tenderness.    Neurologic:  Alert and oriented x3;  grossly normal neurologically. Psych:  Alert and cooperative. Normal mood and affect.   Imaging Studies: No results found.  Labs: CBC    Component Value Date/Time   WBC 5.3 11/03/2023 0933   RBC 4.54 11/03/2023 0933   HGB 14.4 11/03/2023 0933   HGB 14.6 06/16/2018 0934   HCT 42.6 11/03/2023 0933   HCT 43.0 06/16/2018 0934   PLT 186.0 11/03/2023 0933   PLT 221 06/16/2018 0934   MCV 93.9 11/03/2023 0933   MCV 93 06/16/2018 0934    CMP     Component Value Date/Time   NA 138 11/03/2023 0933   NA 140 08/21/2019 1044   K 4.4 11/03/2023 0933   CL 102 11/03/2023 0933   CO2 29 11/03/2023 0933   GLUCOSE 116 (H) 11/03/2023 0933   BUN 19 11/03/2023 0933   BUN 21 08/21/2019 1044   CREATININE 1.04 11/03/2023 0933   CALCIUM 9.2 11/03/2023 0933   PROT 7.4 11/03/2023 0933   PROT 7.1 08/21/2019 1044   ALBUMIN 4.6 11/03/2023 0933   ALBUMIN 4.6 08/21/2019 1044   AST 23 11/03/2023 0933   ALT 20 11/03/2023 0933   ALKPHOS 67 11/03/2023 0933   BILITOT 0.8 11/03/2023 0933   BILITOT 0.4 08/21/2019 1044   GFRNONAA 86 08/21/2019 1044   GFRAA 100 08/21/2019 1044    Assessment and Plan:   [name] is a 63 y.o. y/o male has been referred for:   1.  Dysphagia to solid foods.  Differential includes esophageal stricture or esophagitis. - Scheduling EGD with possible dilation I discussed risks of EGD with patient to include risk of bleeding, perforation, and risk of sedation.  Patient expressed understanding and agrees to proceed with EGD.   2.  GERD - Recommend Lifestyle Modifications to prevent Acid Reflux.  Rec. Avoid coffee, sodas, peppermint, garlic, onions, alcohol, citrus fruits, chocolate, tomatoes, fatty and spicey foods.  Avoid eating 2-3 hours before bedtime.   - Take OTC Pepcid  (famotidine) 20 mg twice daily.  3.  Constipation: Improved on MiraLAX - Continue OTC Miralax Powder; Mix 1 capful in 6 to 8 ounces of a drink once daily - Recommend high-fiber diet, 30 g of fiber daily - Eat fruits, vegetables, and whole grains - Drink 64 ounces of water / fluids daily.    4.  Colon cancer screening - Request previous colonoscopy and EGD reports from [facility] for review.  Then we can decide timing of next colonoscopy.   Follow up 4 weeks after EGD with TG.  Robert Console, PA-C

## 2024-02-16 ENCOUNTER — Ambulatory Visit: Admitting: Physician Assistant

## 2024-02-16 ENCOUNTER — Encounter: Payer: Self-pay | Admitting: Physician Assistant

## 2024-02-16 VITALS — BP 110/58 | HR 64 | Ht 67.0 in | Wt 170.0 lb

## 2024-02-16 DIAGNOSIS — R1310 Dysphagia, unspecified: Secondary | ICD-10-CM

## 2024-02-16 DIAGNOSIS — K219 Gastro-esophageal reflux disease without esophagitis: Secondary | ICD-10-CM | POA: Diagnosis not present

## 2024-02-16 DIAGNOSIS — Z1211 Encounter for screening for malignant neoplasm of colon: Secondary | ICD-10-CM | POA: Diagnosis not present

## 2024-02-16 NOTE — Patient Instructions (Addendum)
 For heartburn or acid reflux: - Recommend Lifestyle Modifications to prevent Acid Reflux.  Rec. Avoid coffee, sodas, peppermint, garlic, onions, alcohol, citrus fruits, chocolate, tomatoes, fatty and spicey foods.  Avoid eating 2-3 hours before bedtime.   - You can take OTC Pepcid (famotidine) 20 mg 1 tablet twice daily.  For constipation: - Continue OTC MiraLAX mix 1 capful in a drink daily. - - Recommend High Fiber diet with fruits, vegetables, and whole grains. - Drink 64 ounces of Fluids Daily.    You have been scheduled for an Endoscopy. Please follow written instructions given to you at your visit today.  If you use inhalers (even only as needed), please bring them with you on the day of your procedure.  If you take any of the following medications, they will need to be adjusted prior to your procedure:   DO NOT TAKE 7 DAYS PRIOR TO TEST- Trulicity (dulaglutide) Ozempic, Wegovy (semaglutide) Mounjaro (tirzepatide) Bydureon Bcise (exanatide extended release)  DO NOT TAKE 1 DAY PRIOR TO YOUR TEST Rybelsus (semaglutide) Adlyxin (lixisenatide) Victoza (liraglutide) Byetta (exanatide) ___________________________________________________________________________  Please follow up sooner if symptoms increase or worsen  Due to recent changes in healthcare laws, you may see the results of your imaging and laboratory studies on MyChart before your provider has had a chance to review them.  We understand that in some cases there may be results that are confusing or concerning to you. Not all laboratory results come back in the same time frame and the provider may be waiting for multiple results in order to interpret others.  Please give us  48 hours in order for your provider to thoroughly review all the results before contacting the office for clarification of your results.   Thank you for trusting me with your gastrointestinal care!   Ellouise Console,  PA-C _______________________________________________________  If your blood pressure at your visit was 140/90 or greater, please contact your primary care physician to follow up on this.  _______________________________________________________  If you are age 62 or older, your body mass index should be between 23-30. Your Body mass index is 26.63 kg/m. If this is out of the aforementioned range listed, please consider follow up with your Primary Care Provider.  If you are age 19 or younger, your body mass index should be between 19-25. Your Body mass index is 26.63 kg/m. If this is out of the aformentioned range listed, please consider follow up with your Primary Care Provider.   ________________________________________________________  The [facility] providers would like to encourage you to use MYCHART to communicate with providers for non-urgent requests or questions.  Due to long hold times on the telephone, sending your provider a message by [facility] may be a faster and more efficient way to get a response.  Please allow 48 business hours for a response.  Please remember that this is for non-urgent requests.  _______________________________________________________

## 2024-03-09 ENCOUNTER — Ambulatory Visit: Admitting: Internal Medicine

## 2024-03-09 ENCOUNTER — Encounter: Payer: Self-pay | Admitting: Internal Medicine

## 2024-03-09 VITALS — BP 116/68 | HR 56 | Temp 98.4°F | Resp 11 | Ht 67.0 in | Wt 170.0 lb

## 2024-03-09 DIAGNOSIS — K2270 Barrett's esophagus without dysplasia: Secondary | ICD-10-CM

## 2024-03-09 DIAGNOSIS — R1310 Dysphagia, unspecified: Secondary | ICD-10-CM

## 2024-03-09 DIAGNOSIS — R1319 Other dysphagia: Secondary | ICD-10-CM

## 2024-03-09 DIAGNOSIS — K449 Diaphragmatic hernia without obstruction or gangrene: Secondary | ICD-10-CM

## 2024-03-09 DIAGNOSIS — K219 Gastro-esophageal reflux disease without esophagitis: Secondary | ICD-10-CM

## 2024-03-09 MED ORDER — SODIUM CHLORIDE 0.9 % IV SOLN: 500.0000 mL | Freq: Once | INTRAVENOUS | Status: DC

## 2024-03-09 NOTE — Progress Notes (Signed)
 Called to room to assist during endoscopic procedure.  Patient ID and intended procedure confirmed with present staff. Received instructions for my participation in the procedure from the performing physician.

## 2024-03-09 NOTE — Progress Notes (Signed)
 Transferred to PACU via stretcher.  Not responding to stimulation at this time.  VSS upon leaving procedure room.

## 2024-03-09 NOTE — Progress Notes (Signed)
 See office note dated 02/16/2024 for details and current H&P  Patient presenting for upper endoscopy to evaluate history of GERD and solid food dysphagia  He remains appropriate for upper endoscopy with possible dilation in the LEC today.

## 2024-03-09 NOTE — Patient Instructions (Addendum)
 Resume previous diet Continue present medications Await pathology results  Handouts/information given for GERD and dysphagia  USTED TUVO UN PROCEDIMIENTO ENDOSCPICO HOY EN EL [facility]:   Lea el informe del procedimiento que se le entreg para cualquier pregunta especfica sobre lo que se dentist.  Si el informe del examen no responde a sus preguntas, por favor llame a su gastroenterlogo para aclararlo.  Si usted solicit que no se le den lowe's companies de lo que se clinical cytogeneticist en su procedimiento al marathon oil va a cuidar, entonces el informe del procedimiento se ha incluido en un sobre sellado para que usted lo revise despus cuando le sea ms conveniente.   LO QUE PUEDE ESPERAR: Algunas sensaciones de hinchazn en el abdomen.  Puede tener ms gases de lo normal.  El caminar puede ayudarle a eliminar el aire que se le puso en el tracto gastrointestinal durante el procedimiento y reducir la hinchazn.  Si le hicieron una endoscopia inferior (como una colonoscopia o una sigmoidoscopia flexible), podra notar manchas de sangre en las heces fecales o en el papel higinico.  Si se someti a una preparacin intestinal para su procedimiento, es posible que no tenga una evacuacin intestinal normal durante time warner.   Tenga en cuenta:  Es posible que note un poco de irritacin y congestin en la nariz o algn drenaje.  Esto es debido al oxgeno applied materials durante su procedimiento.  No hay que preocuparse y esto debe desaparecer ms o regulatory affairs officer.   SNTOMAS PARA REPORTAR INMEDIATAMENTE:   Despus de la endoscopia superior (EGD)  Vmitos de retail buyer o material como caf molido   Dolor en el pecho o dolor debajo de los omplatos que antes no tena   Dolor o dificultad persistente para tragar  Falta de aire que antes no tena   Fiebre de 100F o ms  Heces fecales negras y pegajosas   Para asuntos urgentes o de associate professor, puede comunicarse con un gastroenterlogo  a cualquier hora llamando al [phone].  DIETA:  Recomendamos una comida pequea al principio, pero luego puede continuar con su dieta normal.  Tome muchos lquidos, pero debe evitar las bebidas alcohlicas durante 24 horas.    ACTIVIDAD:  Debe planear tomarse las cosas con calma por el resto del da y no debe CONDUCIR ni usar maquinaria pesada patent examiner (debido a los medicamentos de sedacin utilizados durante el examen).     SEGUIMIENTO: Nuestro personal llamar al nmero que aparece en su historial al siguiente da hbil de su procedimiento para ver cmo se siente y para responder cualquier pregunta o inquietud que pueda tener con respecto a la informacin que se le dio despus del procedimiento. Si no podemos contactarle, le dejaremos un mensaje.  Sin embargo, si se siente bien y no tiene english as a second language teacher, no es necesario que nos devuelva la llamada.  Asumiremos que ha regresado a sus actividades diarias normales sin incidentes. Si se le tomaron algunas biopsias, le contactaremos por telfono o por carta en las prximas 3 semanas.  Si no ha sabido [employer] biopsias en el transcurso de 3 semanas, por favor llmenos al [phone].   FIRMAS/CONFIDENCIALIDAD: Usted y/o el acompaante que le cuide han firmado documentos que se ingresarn en su historial mdico electrnico.  Estas firmas atestiguan el hecho de que la informacin anterior

## 2024-03-09 NOTE — Op Note (Signed)
 [facility] Patient Name: Robert Dudley Procedure Date: 03/09/2024 2:21 PM MRN: [id number] Endoscopist: Gordy CHRISTELLA Starch , MD, [id number] Age: 63 Referring MD:  Date of Birth: [birth year] Gender: Male Account #: [id number] Procedure:                Upper GI endoscopy Indications:              Dysphagia, Gastro-esophageal reflux disease Medicines:                Monitored Anesthesia Care Procedure:                Pre-Anesthesia Assessment:                           - Prior to the procedure, a History and Physical                            was performed, and patient medications and                            allergies were reviewed. The patient's tolerance of                            previous anesthesia was also reviewed. The risks                            and benefits of the procedure and the sedation                            options and risks were discussed with the patient.                            All questions were answered, and informed consent                            was obtained. Prior Anticoagulants: The patient has                            taken no anticoagulant or antiplatelet agents. ASA                            Grade Assessment: II - A patient with mild systemic                            disease. After reviewing the risks and benefits,                            the patient was deemed in satisfactory condition to                            undergo the procedure.                           After obtaining informed consent, the endoscope was  passed under direct vision. Throughout the                            procedure, the patient's blood pressure, pulse, and                            oxygen saturations were monitored continuously. The                            GIF [id number] #[id number] was introduced through the                            mouth, and advanced to the second part of duodenum.                            The upper GI  endoscopy was accomplished without                            difficulty. The patient tolerated the procedure                            well. Scope In: Scope Out: Findings:                 Diffuse mild mucosal changes characterized by                            congestion, granularity and altered texture were                            found in the lower third of the esophagus. Biopsies                            were obtained from the proximal and distal                            esophagus with cold forceps for histology of                            suspected reflux and to exclude eosinophilic                            esophagitis.                           No endoscopic abnormality was evident in the                            esophagus to explain the patient's complaint of                            dysphagia. It was decided, however, to proceed with                            dilation in the mid esophagus, in the  distal                            esophagus and at the gastroesophageal junction. A                            TTS dilator was passed through the scope. Dilation                            with an 18-19-20 mm balloon dilator was performed                            to 20 mm. The dilation site was examined and showed                            no change.                           A 1 cm hiatal hernia was present.                           The entire examined stomach was normal.                           The examined duodenum was normal. Complications:            No immediate complications. Estimated Blood Loss:     Estimated blood loss was minimal. Impression:               - Congested, granular, texture changed mucosa in                            the esophagus. Biopsies were taken with a cold                            forceps for evaluation of acid reflux and to                            exclude eosinophilic esophagitis.                           - No endoscopic  esophageal abnormality to explain                            patient's dysphagia. Esophagus dilated to 20 mm                            with balloon.                           - 1 cm hiatal hernia.                           - Normal stomach.                           -  Normal examined duodenum. Recommendation:           - Patient has a contact number available for                            emergencies. The signs and symptoms of potential                            delayed complications were discussed with the                            patient. Return to normal activities tomorrow.                            Written discharge instructions were provided to the                            patient.                           - Resume previous diet.                           - Continue present medications.                           - Await pathology results.                           - Depending on biopsy results may institute medical                            therapy for GERD. If unremarkable then manometry                            should be considered to evaluate motility. Gordy CHRISTELLA Starch, MD 03/09/2024 3:06:58 PM This report has been signed electronically.

## 2024-03-09 NOTE — Progress Notes (Unsigned)
 Translator stick used with pt communication. [id number] - Robert Dudley

## 2024-03-12 ENCOUNTER — Telehealth: Payer: Self-pay

## 2024-03-12 NOTE — Telephone Encounter (Signed)
  Follow up Call-     03/09/2024    1:57 PM  Call back number  Post procedure Call Back phone  # [phone]  Permission to leave phone message Yes     Patient questions:  Do you have a fever, pain , or abdominal swelling? No. Pain Score  0 *  Have you tolerated food without any problems? Yes.    Have you been able to return to your normal activities? Yes.    Do you have any questions about your discharge instructions: Diet   No. Medications  No. Follow up visit  No.  Do you have questions or concerns about your Care? No.  Actions: * If pain score is 4 or above: No action needed, pain <4.

## 2024-03-14 ENCOUNTER — Ambulatory Visit: Payer: Self-pay | Admitting: Internal Medicine

## 2024-03-14 LAB — SURGICAL PATHOLOGY

## 2024-03-15 MED ORDER — PANTOPRAZOLE SODIUM 40 MG PO TBEC: 40.0000 mg | DELAYED_RELEASE_TABLET | Freq: Every day | ORAL | 2 refills | Status: DC

## 2024-03-15 NOTE — Telephone Encounter (Addendum)
 03/15/24 4:15 PM Correction: I was very pleased, thank you . Blessings.   Dear Nurse Naomie, thank you for the message you sent to my [facility] at [facility] in [place], [place]. I will try to pick it up at 5:00 pm when I get off work. Thank you again for the care you provided on the day of my endoscopy, March 09, 2024. I was very impressed with the service I received; all the staff at [facility] deserve my five-star rating. May Almighty God continue to bless you in the science of medicine. With appreciation and love, your patient, Sincerely, Robert Dudley. Blessings today and always.SABRASABRA

## 2024-04-12 NOTE — Progress Notes (Signed)
 "     Ellouise Console, PA-C [address] [place], [place]  [address] Phone: [phone]   Primary Care Physician: Purcell Emil Schanz, MD  Primary Gastroenterologist:  Ellouise Console, PA-C / Dr. Gordy Starch   Chief Complaint: Follow-up dysphagia and GERD   HPI:   Discussed the use of AI scribe software for clinical note transcription with the patient, who gave verbal consent to proceed.  64 year old male returns for follow-up of dysphagia and GERD.  He is currently taking pantoprazole  40 mg 1 tablet once daily.  Acid reflux has greatly improved on this treatment.  He has not had any more episodes of dysphagia since his EGD procedure.  Denies chest pain, heartburn, or any other GI symptoms or concerns today.  03/09/2024 EGD by Dr. Starch: 1 cm hiatal hernia.  Mild esophagitis in the lower third of the esophagus.  No stricture.  She underwent empiric esophageal dilation to 20 mm.  Biopsies were negative for eosinophilic esophagitis.  No evidence of Barrett's.  Biopsies were positive for reflux esophagitis.  He underwent an upper endoscopy in 2016 and a colonoscopy in 2018, both at [facility].  We are requesting GI records.  Patient states the colonoscopy did not reveal any polyps. He also had a colonoscopy in [place]  in 1996.   Current Outpatient Medications  Medication Sig Dispense Refill   acetaminophen (TYLENOL) 325 MG tablet Take 650 mg by mouth every 6 (six) hours as needed.     fexofenadine (ALLEGRA) 180 MG tablet Take 180 mg by mouth daily.     Polyethylene Glycol 3350 (MIRALAX PO) Take 17 g by mouth daily as needed.     pantoprazole  (PROTONIX ) 40 MG tablet Take 1 tablet (40 mg total) by mouth daily. 90 tablet 3   No current facility-administered medications for this visit.    Allergies as of 04/13/2024   (No Known Allergies)    Past Medical History:  Diagnosis Date   History of colon polyps     Past Surgical History:  Procedure Laterality Date    APPENDECTOMY  1983   [place]OSCOPY  1998   [place]   COLONOSCOPY  11/02/2016   [facility]   ESOPHAGOGASTRODUODENOSCOPY  2016   [facility]    Review of Systems:    All systems reviewed and negative except where noted in HPI.    Physical Exam:  BP 112/72   Pulse 65   Ht 5' 7.5 (1.715 m)   Wt 174 lb 6 oz (79.1 kg)   SpO2 98%   BMI 26.91 kg/m  No LMP for male patient.  General: Well-nourished, well-developed in no acute distress.  Neuro: Alert and oriented x 3.  Grossly intact.  Psych: Alert and cooperative, normal mood and affect.   Imaging Studies: No results found.  Labs: CBC    Component Value Date/Time   WBC 5.3 11/03/2023 0933   RBC 4.54 11/03/2023 0933   HGB 14.4 11/03/2023 0933   HGB 14.6 06/16/2018 0934   HCT 42.6 11/03/2023 0933   HCT 43.0 06/16/2018 0934   PLT 186.0 11/03/2023 0933   PLT 221 06/16/2018 0934   MCV 93.9 11/03/2023 0933   MCV 93 06/16/2018 0934   MCH 31.6 06/16/2018 0934   MCHC 33.9 11/03/2023 0933   RDW 13.2 11/03/2023 0933   RDW 12.4 06/16/2018 0934   LYMPHSABS 1.4 11/03/2023 0933   LYMPHSABS 1.6 06/16/2018 0934   MONOABS 0.3 11/03/2023 0933   EOSABS 0.0 11/03/2023 0933   EOSABS  0.0 06/16/2018 0934   BASOSABS 0.1 11/03/2023 0933   BASOSABS 0.1 06/16/2018 0934    CMP     Component Value Date/Time   NA 138 11/03/2023 0933   NA 140 08/21/2019 1044   K 4.4 11/03/2023 0933   CL 102 11/03/2023 0933   CO2 29 11/03/2023 0933   GLUCOSE 116 (H) 11/03/2023 0933   BUN 19 11/03/2023 0933   BUN 21 08/21/2019 1044   CREATININE 1.04 11/03/2023 0933   CALCIUM 9.2 11/03/2023 0933   PROT 7.4 11/03/2023 0933   PROT 7.1 08/21/2019 1044   ALBUMIN 4.6 11/03/2023 0933   ALBUMIN 4.6 08/21/2019 1044   AST 23 11/03/2023 0933   ALT 20 11/03/2023 0933   ALKPHOS 67 11/03/2023 0933   BILITOT 0.8 11/03/2023 0933   BILITOT 0.4 08/21/2019 1044   GFRNONAA 86 08/21/2019 1044   GFRAA 100 08/21/2019 1044     Assessment and Plan:   Robert Dudley is a  64 y.o. y/o male returns for follow-up of:  1.  Dysphagia: Resolved on PPI s/p esophageal dilation 2.  GERD with reflux esophagitis 3.  Colon cancer screening: Negative colonoscopy 2018.  10-year repeat colonoscopy due 2028. 4.  Constipation: Controlled on MiraLAX  Recent EGD showed no evidence of esophageal stricture.  He underwent empiric esophageal dilation.  Biopsies negative for EOE and Barrett's.  Biopsies consistent with reflux esophagitis.  Treated with pantoprazole  40 mg daily with benefit.  Upper GI symptoms resolved on PPI.  Plan: - Continue pantoprazole  40 mg daily, #90, 3 refills. - Recommend Lifestyle Modifications to prevent Acid Reflux.  Rec. Avoid coffee, sodas, peppermint, garlic, onions, alcohol, citrus fruits, chocolate, tomatoes, fatty and spicey foods.  Avoid eating 2-3 hours before bedtime.   - Continue MiraLAX for constipation - 10-year repeat screening colonoscopy due 2028 (Recall Placed).  Ellouise Console, PA-C  Follow up 1 year to refill medicine (Pantoprazole ).    "

## 2024-04-13 ENCOUNTER — Encounter: Payer: Self-pay | Admitting: Physician Assistant

## 2024-04-13 ENCOUNTER — Ambulatory Visit: Admitting: Physician Assistant

## 2024-04-13 VITALS — BP 112/72 | HR 65 | Ht 67.5 in | Wt 174.4 lb

## 2024-04-13 DIAGNOSIS — K5909 Other constipation: Secondary | ICD-10-CM

## 2024-04-13 DIAGNOSIS — K2100 Gastro-esophageal reflux disease with esophagitis, without bleeding: Secondary | ICD-10-CM

## 2024-04-13 MED ORDER — PANTOPRAZOLE SODIUM 40 MG PO TBEC: 40.0000 mg | DELAYED_RELEASE_TABLET | Freq: Every day | ORAL | 3 refills | Status: AC

## 2024-04-13 NOTE — Patient Instructions (Signed)
 Continue your pantoprazole  40mg  daily. Take 30 minutes prior to food. This has been refilled for you.   You are due for a colonoscopy January 2028. We will contact you when that time gets closer.    _______________________________________________________  If your blood pressure at your visit was 140/90 or greater, please contact your primary care physician to follow up on this.  _______________________________________________________  If you are age 63 or older, your body mass index should be between 23-30. Your Body mass index is 27.31 kg/m. If this is out of the aforementioned range listed, please consider follow up with your Primary Care Provider.  If you are age 49 or younger, your body mass index should be between 19-25. Your Body mass index is 27.31 kg/m. If this is out of the aformentioned range listed, please consider follow up with your Primary Care Provider.   ________________________________________________________  The [facility] providers would like to encourage you to use MYCHART to communicate with providers for non-urgent requests or questions.  Due to long hold times on the telephone, sending your provider a message by [facility] may be a faster and more efficient way to get a response.  Please allow 48 business hours for a response.  Please remember that this is for non-urgent requests.  _______________________________________________________  [facility] is using a team-based approach to care.  Your team is made up of your doctor and two to three APPS. Our APPS (Nurse Practitioners and Physician Assistants) work with your physician to ensure care continuity for you. They are fully qualified to address your health concerns and develop a treatment plan. They communicate directly with your gastroenterologist to care for you. Seeing the Advanced Practice Practitioners on your physician's team can help you by facilitating care more promptly, often allowing for earlier  appointments, access to diagnostic testing, procedures, and other specialty referrals.   I appreciate the opportunity to care for you. Ellouise Console, PA

## 2024-05-04 ENCOUNTER — Encounter: Payer: Self-pay | Admitting: Emergency Medicine

## 2024-11-05 ENCOUNTER — Encounter: Admitting: Emergency Medicine
